# Patient Record
Sex: Female | Born: 1960
Health system: Southern US, Community
[De-identification: ages and names within clinical notes are randomized; demographics above are authoritative.]

## PROBLEM LIST (undated history)

## (undated) DIAGNOSIS — I10 Essential (primary) hypertension: Secondary | ICD-10-CM

## (undated) DIAGNOSIS — F410 Panic disorder [episodic paroxysmal anxiety] without agoraphobia: Secondary | ICD-10-CM

## (undated) DIAGNOSIS — K219 Gastro-esophageal reflux disease without esophagitis: Secondary | ICD-10-CM

---

## 1999-08-31 ENCOUNTER — Ambulatory Visit (HOSPITAL_COMMUNITY): Admission: RE | Admit: 1999-08-31 | Discharge: 1999-08-31 | Payer: Self-pay | Admitting: Obstetrics and Gynecology

## 1999-08-31 ENCOUNTER — Encounter (INDEPENDENT_AMBULATORY_CARE_PROVIDER_SITE_OTHER): Payer: Self-pay | Admitting: Specialist

## 1999-12-15 ENCOUNTER — Other Ambulatory Visit: Admission: RE | Admit: 1999-12-15 | Discharge: 1999-12-15 | Payer: Self-pay | Admitting: Obstetrics and Gynecology

## 1999-12-22 ENCOUNTER — Encounter: Payer: Self-pay | Admitting: Obstetrics and Gynecology

## 1999-12-22 ENCOUNTER — Encounter: Admission: RE | Admit: 1999-12-22 | Discharge: 1999-12-22 | Payer: Self-pay | Admitting: Obstetrics and Gynecology

## 2001-09-27 ENCOUNTER — Encounter: Payer: Self-pay | Admitting: Family Medicine

## 2001-09-27 ENCOUNTER — Encounter: Admission: RE | Admit: 2001-09-27 | Discharge: 2001-09-27 | Payer: Self-pay | Admitting: Family Medicine

## 2002-12-06 ENCOUNTER — Encounter (INDEPENDENT_AMBULATORY_CARE_PROVIDER_SITE_OTHER): Payer: Self-pay | Admitting: Specialist

## 2002-12-06 ENCOUNTER — Observation Stay (HOSPITAL_COMMUNITY): Admission: RE | Admit: 2002-12-06 | Discharge: 2002-12-07 | Payer: Self-pay | Admitting: Obstetrics and Gynecology

## 2003-01-14 ENCOUNTER — Ambulatory Visit (HOSPITAL_COMMUNITY): Admission: RE | Admit: 2003-01-14 | Discharge: 2003-01-14 | Payer: Self-pay | Admitting: Obstetrics and Gynecology

## 2003-01-14 ENCOUNTER — Encounter: Payer: Self-pay | Admitting: Obstetrics and Gynecology

## 2003-06-07 ENCOUNTER — Encounter: Admission: RE | Admit: 2003-06-07 | Discharge: 2003-06-07 | Payer: Self-pay | Admitting: Obstetrics and Gynecology

## 2005-01-30 ENCOUNTER — Ambulatory Visit (HOSPITAL_COMMUNITY): Admission: RE | Admit: 2005-01-30 | Discharge: 2005-01-30 | Payer: Self-pay | Admitting: Family Medicine

## 2005-09-20 ENCOUNTER — Encounter: Admission: RE | Admit: 2005-09-20 | Discharge: 2005-12-19 | Payer: Self-pay | Admitting: Family Medicine

## 2007-08-08 ENCOUNTER — Encounter: Admission: RE | Admit: 2007-08-08 | Discharge: 2007-08-08 | Payer: Self-pay | Admitting: Family Medicine

## 2008-10-22 ENCOUNTER — Encounter: Admission: RE | Admit: 2008-10-22 | Discharge: 2008-10-22 | Payer: Self-pay | Admitting: Family Medicine

## 2009-01-07 ENCOUNTER — Emergency Department (HOSPITAL_COMMUNITY): Admission: EM | Admit: 2009-01-07 | Discharge: 2009-01-07 | Payer: Self-pay | Admitting: Emergency Medicine

## 2009-02-05 ENCOUNTER — Emergency Department: Payer: Self-pay | Admitting: Emergency Medicine

## 2009-03-18 ENCOUNTER — Emergency Department: Payer: Self-pay | Admitting: Emergency Medicine

## 2009-03-21 ENCOUNTER — Observation Stay (HOSPITAL_COMMUNITY): Admission: EM | Admit: 2009-03-21 | Discharge: 2009-03-22 | Payer: Self-pay | Admitting: Emergency Medicine

## 2010-03-18 ENCOUNTER — Encounter: Admission: RE | Admit: 2010-03-18 | Discharge: 2010-03-18 | Payer: Self-pay | Admitting: Family Medicine

## 2010-11-07 LAB — DIFFERENTIAL
Basophils Absolute: 0 10*3/uL (ref 0.0–0.1)
Basophils Relative: 0 % (ref 0–1)
Lymphocytes Relative: 23 % (ref 12–46)
Monocytes Absolute: 0.4 10*3/uL (ref 0.1–1.0)
Neutro Abs: 4.4 10*3/uL (ref 1.7–7.7)
Neutrophils Relative %: 70 % (ref 43–77)

## 2010-11-07 LAB — POCT CARDIAC MARKERS
CKMB, poc: 1.2 ng/mL (ref 1.0–8.0)
Myoglobin, poc: 62.7 ng/mL (ref 12–200)
Myoglobin, poc: 71.7 ng/mL (ref 12–200)
Troponin i, poc: <0.05 ng/mL (ref 0.00–0.09)

## 2010-11-07 LAB — COMPREHENSIVE METABOLIC PANEL
ALT: 18 U/L (ref 0–35)
AST: 27 U/L (ref 0–37)
Albumin: 3.5 g/dL (ref 3.5–5.2)
CO2: 31 mEq/L (ref 19–32)
Chloride: 100 mEq/L (ref 96–112)
Creatinine, Ser: 1.02 mg/dL (ref 0.4–1.2)
GFR calc Af Amer: >60 mL/min (ref >60)
Potassium: 3.1 mEq/L — ABNORMAL LOW (ref 3.5–5.1)
Sodium: 138 mEq/L (ref 135–145)
Total Bilirubin: 1.1 mg/dL (ref 0.3–1.2)

## 2010-11-07 LAB — BASIC METABOLIC PANEL
CO2: 30 mEq/L (ref 19–32)
Calcium: 9.7 mg/dL (ref 8.4–10.5)
Creatinine, Ser: 0.79 mg/dL (ref 0.4–1.2)
GFR calc Af Amer: >60 mL/min (ref >60)
GFR calc non Af Amer: >60 mL/min (ref >60)
Glucose, Bld: 94 mg/dL (ref 70–99)
Sodium: 137 mEq/L (ref 135–145)

## 2010-11-07 LAB — CBC
HCT: 36 % (ref 36.0–46.0)
Hemoglobin: 12.1 g/dL (ref 12.0–15.0)
Hemoglobin: 12.9 g/dL (ref 12.0–15.0)
MCHC: 33.3 g/dL (ref 30.0–36.0)
MCHC: 33.7 g/dL (ref 30.0–36.0)
RBC: 4.27 MIL/uL (ref 3.87–5.11)
RDW: 13.1 % (ref 11.5–15.5)

## 2010-11-07 LAB — URINALYSIS, ROUTINE W REFLEX MICROSCOPIC
Bilirubin Urine: NEGATIVE
Hgb urine dipstick: NEGATIVE
Ketones, ur: NEGATIVE mg/dL
Nitrite: NEGATIVE
Protein, ur: NEGATIVE mg/dL
Urobilinogen, UA: 0.2 mg/dL (ref 0.0–1.0)

## 2010-11-07 LAB — CARDIAC PANEL(CRET KIN+CKTOT+MB+TROPI): CK, MB: 1 ng/mL (ref 0.3–4.0)

## 2010-11-07 LAB — POCT PREGNANCY, URINE: Preg Test, Ur: NEGATIVE

## 2010-12-15 NOTE — Consult Note (Signed)
NAMEJEANITA, Baker            ACCOUNT NO.:  [id number]   MEDICAL RECORD NO.:  [id number]          PATIENT TYPE:  INP   LOCATION:  3735                         FACILITY:  [facility]   PHYSICIAN:  Francisca December, M.D.  DATE OF BIRTH:  [birth year]   DATE OF CONSULTATION:  DATE OF DISCHARGE:  03/22/2009                                 CONSULTATION   REQUESTING PHYSICIAN:  [facility].   REASON FOR CONSULTATION:  Chest discomfort and palpitation.   HISTORY OF PRESENT ILLNESS:  Kristi Baker is a pleasant 49-year-  old African American woman who presented to [facility] yesterday with  symptoms of palpitation and chest discomfort.  The chest discomfort is  pressure like over the anterior substernal upper portion of the chest.  It does not radiate.  She has been having some episodes of chest  discomfort and lightheadedness for 3 months now, general feeling of  malaise associated with this.  On Tuesday, 4 days prior to admission,  she while sitting at her desk had the abrupt onset of a tachy  palpitation which was associated with lightheadedness and near syncope.  She had chest tightness associated that.  She had her blood pressure  blood pressure checked which was found to be elevated.  She cannot  recall what her pulse was at that time.  She went home and rested.  On  the day prior to admission, she had additional episode of palpitation  and chest discomfort, 2 of which occurred with minimal exertion.  They  have occurred in the past at rest.  Episodes last 3-5 minutes and then  slowly resolve on their own.  She cannot recall having chest discomfort  with rapid feeling of palpitation.  She has also had single palpitations  which are self limiting.   Her cardiac risk factors are hypertension only.  By her own account, her  lipids are well controlled.  She is not diabetic.  There is no family  history of early coronary artery disease.   PAST MEDICAL HISTORY:  1.  Hypertension.  2. GERD.  3. Status post hysterectomy and salpingo-oophorectomy.  She has had C-      sections in the past and breast lumpectomy surgery.   FAMILY HISTORY:  Significant for diabetes and hypertension in the father  and mother respectively.  She has 1 sister with breast and colon  carcinoma.   SOCIAL HISTORY:  She lives here in [place].  Her adult daughter has  recently moved in with her granddaughter.  She is caring for her elderly  mother who is ill and recently placed in a nursing home.  She feels  under an extraordinary amount of stress on an intermittent basis.  She  is not using alcohol or tobacco products.  No drug use.  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   CURRENT MEDICATIONS:  1. HCTZ 12.5 mg p.o. daily p.r.n. edema.  2. Pepcid AC 1 p.o. daily  3. Hydroxyzine 10 mg p.o. t.i.d. p.r.n. stress/anxiety.   ALLERGIES:  PENICILLIN and VALIUM caused paradoxical  reaction with  insomnia x2 days.  She has tolerated Xanax in the past.   REVIEW OF SYSTEMS:  Ten systems review is negative except as mentioned  above.   PHYSICAL EXAMINATION:  VITAL SIGNS:  Blood pressure is 126/54, pulse is  70 and regular, respiratory rate 14, temperature 99.1, and O2 saturation  100% on room air.  GENERAL:  This is a well-appearing, mildly obese, pleasant 50 year old  African American woman, in no distress.  HEENT:  Unremarkable.  Head is atraumatic and normocephalic.  Her pupils  are equal, round, and reactive to light.  Sclerae are anicteric.  Oral  mucosa is pink and moist.  Teeth and gums in good repair.  Tongue is not  coated.  NECK:  Supple without thyromegaly or masses.  The carotid upstrokes are  normal.  There is no bruit.  There is no JVD.  CHEST:  Clear with adequate excursion.  No wheezes, rales, or rhonchi.  HEART:  Regular rhythm.  Normal S1 and S2.  No S3 or S4 murmur, click,  or rub noted.  ABDOMEN:  Soft,  obese, and nontender.  No midline pulsatile mass.  Bowel  sounds present all quadrants.  EXTERNAL GENITALIA:  Without lesions.  RECTAL:  Not performed.  EXTREMITIES:  Full range of motion.  No edema.  Intact distal pulses.  NEUROLOGIC:  Cranial nerves II-XII intact.  Motor and sensory grossly  intact.  Gait is intact as well.  SKIN:  Without rashes or petechiae.  Otherwise, warm, dry, and clear.   ACCESSORY CLINICAL DATA:  Admission hemogram is normal.  D-dimer is  0.26.  Serum electrolytes normal with the exception of a potassium of  3.4, falling to 3.1 this morning.  Liver associated enzymes are normal.  Point-of-care cardiac enzymes negative.  Laboratory CK-MB and troponin  negative.  Urinalysis negative.   Echocardiogram, sinus rhythm and normal.   Admission chest x-ray, no active cardiopulmonary disease.   ASSESSMENT:  1. Atypical angina.  2. Tachy palpitation.  3. Presyncope.  4. Hypertension.  5. Gastroesophageal reflux disease.  6. Obesity.  7. Myocardial infarction has been ruled out.  8. Hypokalemia.   PLAN/RECOMMENDATIONS:  I agree with your management thus far which  includes aspirin, DVT prophylaxis Lovenox, and PPI.  We will plan for  exercise myocardial perfusion scintigraphy in the near future for  further risk assessment.  If negative, we will likely place on a 24-hour  ambulatory monitor as an outpatient. We will replete her hypokalemia  with oral potassium chloride today, likely coming from her p.r.n. use of  HCTZ.      Francisca December, M.D.    JHE/MEDQ  D:  03/22/2009  T:  03/22/2009  Job:  [id number]   cc:   C. Duane Lope, M.D.

## 2010-12-15 NOTE — H&P (Signed)
Kristi Baker, Kristi Baker            ACCOUNT NO.:  [id number]   MEDICAL RECORD NO.:  [id number]          PATIENT TYPE:  INP   LOCATION:  3735                         FACILITY:  [facility]   PHYSICIAN:  Donalynn Furlong, MD      DATE OF BIRTH:  06-09-1961   DATE OF ADMISSION:  03/21/2009  DATE OF DISCHARGE:                              HISTORY & PHYSICAL   PRIMARY CARE PHYSICIAN:  Dr. Laural Benes   CHIEF COMPLAINT:  Dizziness, near syncopal episode, palpitations and  chest pain.   HISTORY OF PRESENT ILLNESS:  Kristi Baker is a 50 year old African  American female who lives in [place] with her husband and family.  She presented to [facility] ED today.  She is started having feeling of  palpitation with chest pain on Tuesday as she was walking, she felt like  she was about to pass out but she did not pass out completely.  She was  feeling very dizzy on Tuesday after that she started having pressure-  like chest pain on the left side of chest associated with palpitations  which resolved on its own.  She had another episode on Tuesday with the  chest pain and palpitations.  She also felt worse on Friday with more  chest pain, palpitation.  Chest pain is pressure-like in nature over the  left side of chest without any radiation, no aggravating or relieving  factors.  Chest pain comes on on its own and then resolves on its own.  The patient denies any history of coronary artery disease or cardiac  workup in the past.  The patient has a family history of diabetes  mellitus in the father and hypertension in the mother.  The patient  denies any history of clots in the lung.  The patient denies any recent  immobilization.  The patient does have a history of gastroesophageal  reflux disease but her pain feels different than the gastroesophageal  reflux disease.  The patient denies any sick contacts or any pulmonary  infections in the past.   PAST MEDICAL HISTORY:  Hypertension, gastroesophageal reflux  disease,  hysterectomy, ovarian removal with a salpingo-oophorectomy, C-section in  the past, breast reaction surgery in the past.   FAMILY HISTORY:  Of cancer in the sister with breast cancer and colon  cancer.  Father with a diabetes mellitus.  Mother with hypertension.   SOCIAL HISTORY:  The patient denied history alcohol, drug or tobacco  use.  The patient lives with her husband and kids in [place].  She  works full-time.   REVIEW OF SYSTEMS:  Positive as per HPI, otherwise negative review  systems done for 14 systems.   ALLERGIES:  PENICILLIN.   HOME MEDICATION:  1. Hydroxyzine 10 mg p.o. t.i.d. p.r.n. for stress.  2. Hydrochlorothiazide 12.5 mg p.r.n. p.o. for high blood pressure.   PHYSICAL EXAM:  VITAL SIGNS:  Blood pressure 126/54, pulse 69,  respirations 13, temperature 99.1, oxygen saturation 100% on room air.  GENERAL:  Exam alert, oriented x3 laying in bed reading book and not in  acute distress.  CARDIOVASCULAR:  S1-S2 regular.  No murmur  or gallop.  LUNGS:  Clear to auscultation bilaterally.  No wheezing, rhonchi or  crackles.  ABDOMEN:  Nontender, nondistended.  Bowel sounds present.  EXTREMITIES:  No clubbing, cyanosis or edema.  Pulses palpable in all  extremities.  HEAD:  Normocephalic nontraumatic.  EYES:  Pupils react to light and accomodation.  She has reading glasses.  ORAL CAVITY:  Dry.  No thrush noted.  NECK:  No thyromegaly or JVD.  The patient is obese at neck.  SKIN:  No rash or bruise.  NEUROLOGY:  Shows intact cranial nerves, muscular strength, sensation  and reflexes.   LABORATORY DATA:  Shows EKG rate 65 per minute, normal sinus rhythm,  normal axis, normal intervals, normal ST-T waves, no prior EKG to check  for the comparison.  Basic metabolic panel unremarkable except potassium  3.4.  First set of cardiac enzymes negative.  CBC differential  unremarkable.  Chest x-ray unremarkable for any disease, D-dimer  pending.   ASSESSMENT AND  PLAN:  1. Near syncopal episode.  Dizziness and palpitation.  2. Pressure-like chest pain.  3. Hypokalemia.  4. History of hypertension.  5. History of gastroesophageal reflux disease.   PLAN:  Will admit the patient on telemetry bed under [facility] with  a diagnosis of chest pain, hypokalemia, dizziness, weakness.  The  patient will be on telemetry bed.  Will check CBC, CMP in the morning.  Will get couple more sets of cardiac enzymes with troponin tonight and  get EKG in the morning.  Will check urine pregnancy test, we will check  D-dimer now, check vital signs every 8 hours.  Will provide regular diet  with her normal saline 50 mL per hour for 1 liter.  We will start  Lovenox 40 mg subcu q. 24-hour for DVT prophylaxis.  Will start Nexium  for GI prophylaxis.  Will provide aspirin 325 mg q.24 h.  Will start  nitroglycerin p.r.n. for chest pain and provide potassium 40 mEq p.o.  q.12 h p.r.n. for 2 doses.  Will provide Tylenol, Phenergan and Ambien  p.r.n. for symptomatic control.  We will provide her with metoprolol,  lisinopril, simvastatin and Lovenox per MI rule out protocol.  Check  stress test on her tomorrow after we rule out MI.      Donalynn Furlong, MD  Electronically Signed     Donalynn Furlong, MD  Electronically Signed    TVP/MEDQ  D:  03/21/2009  T:  03/22/2009  Job:  [id number]   cc:   Dr. Laural Benes and Tenny Craw

## 2010-12-18 NOTE — Op Note (Signed)
[facility]  Patient:    Kristi Baker                    MRN: [id number] Proc. Date: 08/31/99 Adm. Date:  [id number] Attending:  Leonard Schwartz CC:         Kristi Baker, M.D.                           Operative Report  PREOPERATIVE DIAGNOSIS:       8 cm left dermoid cyst.  Fibroid uterus.  Obesity  (weight is 223 pounds).  POSTOPERATIVE DIAGNOSIS:      8 cm left dermoid cyst.  Fibroid uterus.  Obesity  (weight is 223 pounds).  OPERATION:                    Diagnostic laparoscopy with laparoscopic left salpingo-oophorectomy.  SURGEON:                      Janine Limbo, M.D.  ASSISTANT:  ANESTHESIA:                   General anesthesia.  ESTIMATED BLOOD LOSS:  INDICATIONS:                  Kristi Baker is a 50 year old female, para 2-0-0-2, who presents with a left dermoid cyst diagnosed by ultrasound.  The patient understands the indications for her surgical procedure and she accepts the risks of, but not limited to anesthetic complications, bleeding, infections, and possible damage to the surrounding organs.  FINDINGS:                     There was an 8 cm left dermoid cyst present.  The  right ovary appeared normal.  The fallopian tubes were normal except for defects from the patients prior tubal ligation.  The uterus had multiple fibroids the largest of which measuring approximately 3 x 5 cm.  It was located on the posterior surface toward the left.  The appendix, bowel, and liver appeared normal.  We did not spill the contents of the dermoid cyst in the abdominal cavity with removal.  DESCRIPTION OF PROCEDURE:     The patient was taken to the operating room where a general anesthesia was given.  The patients abdomen, perineum, and vagina were prepped with multiple layers of Betadine.  Examination under anesthesia was performed.  A Foley catheter was placed in the bladder.  A Hulka tenaculum was placed  inside the uterus.  The patient was sterilely draped.  The subumbilical rea was injected with 0.25% Marcaine.  An incision was made and the Veress needle was inserted into the abdominal cavity without difficulty.  Proper placement was confirmed using the saline drop test.  A pneumoperitoneum was obtained.  The laparoscopic trocar and then the laparoscope were substituted for the Veress needle. Care was taken not to damage the underlying structures.  The pelvic organs were visualized.  Two suprapubic incisions were made after injecting the skin with 0.25% Marcaine.  A 5 mm port was placed in the right lower quadrant and a 10/12 mm port was placed in the left lower quadrant.  Pictures were taken of the patients pelvic anatomy.  The left ureter was identified and followed throughout its course in the pelvis.  It was felt to be away from our areas of surgery.  An endoloop  as placed around the left infundibulopelvic ligament and tied securely.  0 Vicryl as the suture material used.  We were unable to place a second endoloop and therefore, a free tie was placed around the infundibulopelvic ligament using the laparoscopic instruments.  The ligament was then cut.  The endobasket was placed through the  10/12 mm port into the pelvic cavity.  The large dermoid cyst was placed in the  endobasket.  An incision was made on the capsule of the ovary and the contents f the dermoid cyst were removed using a Nezhat aspirator.  We then tied the bag securely and the instrument was removed from the abdominal cavity.  The incision in the right lower quadrant was then enlarged and the specimen was removed.  The fascia was closed using a running suture of 0 Vicryl.  The subcutaneous layer was closed using 2-0 Vicryl.  The skin was reapproximated using 4-0 Vicryl.  The pelvis was reinflated.  The pelvic cavity was irrigated thoroughly.  There was no signs of bleeding and no signs of contents in the  abdominal cavity.  Contents of the dermoid cyst inside the abdominal cavity.  The pneumoperitoneum was allowed to escape.  Hemostasis was again confirmed and all instruments were removed.  The incisions  were closed using deep and superficial sutures of 4-0 Vicryl.  Sponge, needle, nd instrument counts were correct x 2 occasions.  The estimated blood loss for the  procedure was 10 cc.  The patient tolerated her procedure well.  She was noted o drain clear yellow urine.  We did inspect the bowel carefully prior to removing all instruments and there was no evidence of harm.  FOLLOW-UP:                    The patient was given Tylenol No.3 one to two p.o. q.4h. p.r.n. pain.  She was told to take Advil 600 mg q.6h. for moderate to mild pain.  She was given a copy of the postoperative instruction sheet as prepared y [facility] for patients who have undergone laparoscopy.  She  will call for questions or concerns. DD:  08/31/99 TD:  08/31/99 Job: [id number]

## 2010-12-18 NOTE — Discharge Summary (Signed)
NAME:  Kristi Baker, Kristi Baker                      ACCOUNT NO.:  [id number]   MEDICAL RECORD NO.:  [id number]                   PATIENT TYPE:  OBV   LOCATION:  9322                                 [facility]  [facility]   PHYSICIAN:  Janine Limbo, M.D.            DATE OF BIRTH:  1961/04/25   DATE OF ADMISSION:  12/06/2002  DATE OF DISCHARGE:  12/07/2002                                 DISCHARGE SUMMARY   DISCHARGE DIAGNOSES:  1. Fibroid uterus, increasing.  2. Dysmenorrhea.  3. Pelvic adhesions.  4. Status post left salpingo-oophorectomy.   OPERATION AND FINDINGS:  On the date of admission, patient underwent a total  vaginal hysterectomy with lysis of adhesions, tolerating procedure well.  Patient was found to have a fibroid uterus (174 gm) with a normal-appearing  right tube and ovary along with pelvic adhesions.  Patient's left tube and  ovary were surgically absent.   HISTORY OF PRESENT ILLNESS:  Kristi Baker is a 50 year old female, para 2,  0-0-2, who presents for vaginal hysterectomy because of large fibroids and  increasing dysmenorrhea.  Please see patient's dictated history and physical  examination for details.   PHYSICAL EXAMINATION:  VITAL SIGNS:  Weight 223 pounds.  GENERAL:  Within normal limits.  PELVIC:  External genitalia is normal.  Vagina is normal.  Cervix is  nontender.  Uterus is 10-week size, irregular and firm.  Adnexa:  No masses.  RECTOVAGINAL:  Exam deferred.   HOSPITAL COURSE:  On the date of admission, patient underwent aforementioned  procedures, tolerating them well.  By post-op day #1 the patient had resumed  bowel and bladder function and was therefore deemed ready for discharge  home.  Post-op hemoglobin was 11.2 (pre-op hemoglobin 11.9)   DISCHARGE MEDICATIONS:  1. Vicodin 1-2 tablets every 4-6 hours as needed for pain.  2. Ibuprofen 600 mg 1 tablet with food every 6 hours for 5 days, then as     needed for pain.  3. Phenergan 25 mg 1 tablet  every 6 hours as needed for nausea.  4. Colace 100 mg 1 tablet twice daily until bowel movements are regular.   FOLLOW UP:  Patient is to follow with [facility] for a 6-week  postoperative visit with Dr. Leonard Schwartz.   DISCHARGE INSTRUCTIONS:  Patient was given a copy of [facility]  postoperative instruction sheet.  She was further advised to avoid driving  for 2 weeks, heavy lifting for 4 weeks, and intercourse for 6 weeks.   DIET:  Patient's diet was without restriction.    FINAL PATHOLOGY:  Uterus and Cervix:  Cervix - slight cervicitis and  squamous metaplasia.  No dysplasia identified.  Remote C-section scar was  disordered proliferative endometrium.  No hyperplasia or malignancy  identified.  Leiomyomata, intramural and subserosal:  Uterine serosal  fibrous adhesions.  No endometriosis or malignancy identified.     Elmira J. Powell, P.A.  Janine Limbo, M.D.    EJP/MEDQ  D:  01/08/2003  T:  01/08/2003  Job:  [id number]

## 2010-12-18 NOTE — H&P (Signed)
NAME:  Kristi Baker, Kristi Baker                      ACCOUNT NO.:  [id number]   MEDICAL RECORD NO.:  [id number]                   PATIENT TYPE:  AMB   LOCATION:  SDC                                  FACILITY:  [facility]   PHYSICIAN:  Janine Limbo, M.D.            DATE OF BIRTH:  [birth year]   DATE OF ADMISSION:  12/06/2002  DATE OF DISCHARGE:                                HISTORY & PHYSICAL   HISTORY OF PRESENT ILLNESS:  The patient is a 50 year old female, para 2-0-0-  2, who presents for a vaginal hysterectomy because of large fibroids. An  endometrial biopsy  has been obtained, and endometrium was noted to be  benign.  Non-steroidal anti-inflammatory agents have not relieved her  discomfort.  Her most recent Pap smear was within normal limits.  The  patient complains of increasing dysmenorrhea.   Her prior GYN surgeries include a tubal ligation as well as a low transverse  cesarean section.  In 2001, the patient had a laparoscopic left salpingo-  oophorectomy because of an 8 cm left dermoid cyst.   PAST MEDICAL HISTORY:  The patient had a breast reduction in 1994.  She had  a cyst removed from her right foot.   DRUG ALLERGIES:  PENICILLIN causes a rash   SOCIAL HISTORY:  The patient denies cigarette use, alcohol use, and  recreational drug use.   REVIEW OF SYSTEMS:  Noncontributory.   FAMILY HISTORY:  The patient's sister has breast cancer.  Her father has  diabetes mellitus.  The patient's mother has hypertension.   PHYSICAL EXAMINATION:  VITAL SIGNS:  Weight 223 pounds.  HEENT:  Within normal limits.  CHEST:  Clear.  HEART:  Regular rate and rhythm.  BREASTS:  Without masses.  ABDOMEN:  Nontender.  EXTREMITIES:  Within normal limits.  NEUROLOGIC:  Grossly normal.  PELVIC:  External genitalia is normal. The vagina is normal.  Cervix is  nontender.  Uterus is 10-week size, irregular, and firm.  Adnexa no masses.  Rectovaginal exam confirms.   ASSESSMENT:  1. Fibroid  uterus.  2. Increasing dysmenorrhea.   PLAN:  We have discussed the alternatives for therapy which include pain  medications, uterine artery embolization, IUD use, cryoablation,  laparoscopic surgery, and hormonal therapy.  The patient has carefully  considered the risks  and benefits of all of the above and has decided to proceed with definitive  therapy by way of vaginal hysterectomy.  She understands the indications for  her procedure, and she accepts the risks of, but not limited to, anesthetic  complications, bleeding, infection, and possible damage to the surrounding  organs.                                                Janine Limbo, M.D.    AVS/MEDQ  D:  12/02/2002  T:  12/02/2002  Job:  [id number]

## 2010-12-18 NOTE — Op Note (Signed)
NAME:  Kristi Baker, Kristi Baker                      ACCOUNT NO.:  [id number]   MEDICAL RECORD NO.:  [id number]                   PATIENT TYPE:  OBV   LOCATION:  9322                                 FACILITY:  [facility]   PHYSICIAN:  Janine Limbo, M.D.            DATE OF BIRTH:  May 08, 1961   DATE OF PROCEDURE:  12/06/2002  DATE OF DISCHARGE:                                 OPERATIVE REPORT   PREOPERATIVE DIAGNOSES:  1. Ten week-size fibroid uterus.  2. Increasing dysmenorrhea.  3. Anemia (hemoglobin 11.9).   POSTOPERATIVE DIAGNOSES:  1. Ten week-size fibroid uterus.  2. Increasing dysmenorrhea.  3. Anemia (hemoglobin 11.9).  4. Pelvic adhesions.   PROCEDURE:  Vaginal hysterectomy with lysis of adhesions.   SURGEON:  Janine Limbo, M.D.   ASSISTANT:  Marquis Lunch. Adline Peals.   ANESTHESIA:  General.   DISPOSITION:  The patient is a 50 year old female, para 2-0-0-2, who  presents with the above-mentioned diagnoses.  The patient has had a prior  cesarean section.  She has also had a prior left salpingo-oophorectomy  because of a dermoid cyst.  She understands the indications for her  procedure and she accepts the risks of, but not limited to, anesthetic  complications, bleeding, infection, and possible damage to the surrounding  organs.   FINDINGS:  The uterus was 10 weeks' size and it contained multiple fibroids.  The estimated uterine weight was 190 g.  The left fallopian tube and ovary  were surgically absent.  The right fallopian tube and ovary appeared normal.  There were adhesions between the left pelvic sidewall and the left posterior  uterus.   DESCRIPTION OF PROCEDURE:  The patient was taken to the operating room,  where a general anesthetic was given.  The patient's abdomen, perineum, and  vagina were prepped with multiple layers of Betadine.  A Foley catheter was  placed in the bladder.  Examination under anesthesia was performed.  The  patient was sterilely  draped.  The cervix was injected with a diluted  solution of Pitressin and saline.  A circumferential incision was made  around the cervix and the posterior cul-de-sac was sharply entered.  Alternating from right to left, the uterosacral ligaments and the  paracervical tissues were clamped, cut, sutured, and tied securely.  The  posterior cuff of the vagina was then sutured using a running locking suture  of 0 Vicryl.  We then carefully entered the anterior cul-de-sac.  Care was  taken not to damage the bladder.  Again alternating from right to left, the  parametrial tissue, the uterine arteries, and the upper pedicles were  clamped, cut, sutured, and tied securely.  The uterus was inverted through  the posterior colpotomy.  The remainder of the upper pedicles were clamped  and cut and the uterus was removed from the operative field.  The upper  pedicles were secured using free ties and then suture ligatures.  Hemostasis  was noted to be adequate.  The sutures attached to the uterosacral ligaments  were brought out through the vaginal angles and tied securely.  A McCall  culdoplasty suture was placed in the posterior cul-de-sac, incorporating the  uterosacral ligaments bilaterally and the posterior peritoneum.  A final  check was made for hemostasis and again hemostasis was adequate.  The  bladder was examined, and there was no evidence of damage.  The patient was  noted to drain clear yellow urine.  The vaginal cuff was then closed using  figure-of-eight sutures incorporating the anterior vaginal mucosa, the  anterior peritoneum, the posterior peritoneum, and the posterior vaginal  mucosa.  The McCall culdoplasty suture was tied securely and the apex of the  vagina was noted to elevate into the midpelvis.  Sponge, needle, and  instrument counts were correct on two occasions.  The estimated blood loss  was 75 mL.  The patient tolerated her procedure well.  Vicryl 0 was the  suture material  used throughout.  The patient was awakened from her  anesthetic and then taken to the recovery room in stable condition.                                               Janine Limbo, M.D.    AVS/MEDQ  D:  12/06/2002  T:  12/06/2002  Job:  [id number]

## 2011-01-03 ENCOUNTER — Emergency Department (HOSPITAL_COMMUNITY)
Admission: EM | Admit: 2011-01-03 | Discharge: 2011-01-03 | Disposition: A | Discharge status: Home / Self Care | Payer: 59 | Attending: Emergency Medicine | Admitting: Emergency Medicine

## 2011-01-03 DIAGNOSIS — R300 Dysuria: Secondary | ICD-10-CM | POA: Insufficient documentation

## 2011-01-03 DIAGNOSIS — I10 Essential (primary) hypertension: Secondary | ICD-10-CM | POA: Insufficient documentation

## 2011-01-03 DIAGNOSIS — N3090 Cystitis, unspecified without hematuria: Secondary | ICD-10-CM | POA: Insufficient documentation

## 2011-01-03 LAB — URINE MICROSCOPIC-ADD ON

## 2011-01-03 LAB — URINALYSIS, ROUTINE W REFLEX MICROSCOPIC
Glucose, UA: NEGATIVE mg/dL
pH: 5.5 (ref 5.0–8.0)

## 2011-01-05 LAB — URINE CULTURE

## 2011-04-30 ENCOUNTER — Emergency Department (HOSPITAL_COMMUNITY): Payer: 59

## 2011-04-30 ENCOUNTER — Emergency Department (HOSPITAL_COMMUNITY)
Admission: EM | Admit: 2011-04-30 | Discharge: 2011-04-30 | Disposition: A | Discharge status: Home / Self Care | Payer: 59 | Attending: Emergency Medicine | Admitting: Emergency Medicine

## 2011-04-30 DIAGNOSIS — I10 Essential (primary) hypertension: Secondary | ICD-10-CM | POA: Insufficient documentation

## 2011-04-30 DIAGNOSIS — R0789 Other chest pain: Secondary | ICD-10-CM | POA: Insufficient documentation

## 2011-04-30 DIAGNOSIS — S20219A Contusion of unspecified front wall of thorax, initial encounter: Secondary | ICD-10-CM | POA: Insufficient documentation

## 2011-04-30 DIAGNOSIS — I059 Rheumatic mitral valve disease, unspecified: Secondary | ICD-10-CM | POA: Insufficient documentation

## 2013-02-22 ENCOUNTER — Emergency Department: Payer: Self-pay | Admitting: Emergency Medicine

## 2013-02-22 LAB — COMPREHENSIVE METABOLIC PANEL
Albumin: 3.5 g/dL (ref 3.4–5.0)
Alkaline Phosphatase: 75 U/L (ref 50–136)
Anion Gap: 5 — ABNORMAL LOW (ref 7–16)
BUN: 16 mg/dL (ref 7–18)
Calcium, Total: 8.5 mg/dL (ref 8.5–10.1)
Chloride: 105 mmol/L (ref 98–107)
Co2: 29 mmol/L (ref 21–32)
EGFR (African American): >60
EGFR (Non-African Amer.): >60
Glucose: 98 mg/dL (ref 65–99)
Potassium: 3.4 mmol/L — ABNORMAL LOW (ref 3.5–5.1)

## 2013-02-22 LAB — URINALYSIS, COMPLETE
Bilirubin,UR: NEGATIVE
Blood: NEGATIVE
Ketone: NEGATIVE
Protein: NEGATIVE
Squamous Epithelial: 4
WBC UR: 1 /HPF (ref 0–5)

## 2013-02-22 LAB — CBC
MCH: 26.6 pg (ref 26.0–34.0)
RBC: 4.31 10*6/uL (ref 3.80–5.20)
RDW: 13.5 % (ref 11.5–14.5)

## 2014-05-13 ENCOUNTER — Other Ambulatory Visit: Payer: Self-pay

## 2014-05-13 DIAGNOSIS — Z1231 Encounter for screening mammogram for malignant neoplasm of breast: Secondary | ICD-10-CM

## 2014-05-28 ENCOUNTER — Encounter (INDEPENDENT_AMBULATORY_CARE_PROVIDER_SITE_OTHER): Payer: Self-pay

## 2014-05-28 ENCOUNTER — Ambulatory Visit
Admission: RE | Admit: 2014-05-28 | Discharge: 2014-05-28 | Disposition: A | Discharge status: Home / Self Care | Payer: 59 | Source: Ambulatory Visit

## 2014-05-28 DIAGNOSIS — Z1231 Encounter for screening mammogram for malignant neoplasm of breast: Secondary | ICD-10-CM

## 2015-05-13 ENCOUNTER — Other Ambulatory Visit: Payer: Self-pay

## 2015-05-13 DIAGNOSIS — Z1231 Encounter for screening mammogram for malignant neoplasm of breast: Secondary | ICD-10-CM

## 2015-06-06 ENCOUNTER — Ambulatory Visit: Payer: Self-pay

## 2015-07-09 ENCOUNTER — Ambulatory Visit: Payer: Self-pay

## 2015-08-11 ENCOUNTER — Ambulatory Visit: Payer: Self-pay

## 2015-08-25 ENCOUNTER — Ambulatory Visit: Payer: Self-pay

## 2015-09-09 ENCOUNTER — Ambulatory Visit
Admission: RE | Admit: 2015-09-09 | Discharge: 2015-09-09 | Disposition: A | Discharge status: Home / Self Care | Payer: 59 | Source: Ambulatory Visit

## 2015-09-09 DIAGNOSIS — Z1231 Encounter for screening mammogram for malignant neoplasm of breast: Secondary | ICD-10-CM

## 2016-12-17 ENCOUNTER — Other Ambulatory Visit: Payer: Self-pay | Admitting: Family Medicine

## 2016-12-17 DIAGNOSIS — Z1231 Encounter for screening mammogram for malignant neoplasm of breast: Secondary | ICD-10-CM

## 2017-08-31 ENCOUNTER — Encounter: Payer: Self-pay | Admitting: Podiatry

## 2017-08-31 ENCOUNTER — Ambulatory Visit (INDEPENDENT_AMBULATORY_CARE_PROVIDER_SITE_OTHER): Payer: 59

## 2017-08-31 ENCOUNTER — Ambulatory Visit: Payer: 59 | Admitting: Podiatry

## 2017-08-31 DIAGNOSIS — M722 Plantar fascial fibromatosis: Secondary | ICD-10-CM | POA: Diagnosis not present

## 2017-08-31 DIAGNOSIS — R52 Pain, unspecified: Secondary | ICD-10-CM

## 2017-08-31 MED ORDER — MELOXICAM 15 MG PO TABS: 15.0000 mg | ORAL_TABLET | Freq: Every day | ORAL | 0 refills | Status: DC

## 2017-08-31 NOTE — Progress Notes (Signed)
   Subjective:    Patient ID: Kristi Baker, female    DOB: 01-05-1961, 57 y.o.   MRN: [id number]  HPI This patient presents the office with chief complaint of a painful left heel.  Patient states that the heel has been painful on and off for the last month.  She admits to exercising by walking over the last few weeks.  She says she experiences pain upon rising in the morning and standing from a sitting position.  She says she has occasionally taken ibuprofen for the pain, but the pain continues to worsen.  . She says that her pain level is approximately 5-6 out of 10.  She presents the office today for an evaluation and treatment of her painful left foot    Review of Systems  All other systems reviewed and are negative.      Objective:   Physical Exam General Appearance  Alert, conversant and in no acute stress.  Vascular  Dorsalis pedis and posterior pulses are palpable  bilaterally.  Capillary return is within normal limits  bilaterally. Temperature is within normal limits  Bilaterally.  Neurologic  Senn-Weinstein monofilament wire test within normal limits  bilaterally. Muscle power within normal limits bilaterally.  Nails , normal nails noted with no evidence of fungal or bacterial infection.  Orthopedic  No limitations of motion of motion feet bilaterally.  No crepitus or effusions noted.  . Patient has a mild HAV deformity first MPJ bilaterally.  Patient has limited dorsiflexion noted in the first MPJ of the left foot.  She gets approximately 30 dorsiflexion.  . She also has a prominent dorsomedial exostosis first metatarsal, left foot.  Palpable pain noted at the insertion of the plantar fascia of the left foot.  Skin  normotropic skin with no porokeratosis noted bilaterally.  No signs of infections or ulcers noted.          Assessment & Plan:  Plantar fascitis left heel with FHL  IE  . X-rays reveal calcification at the insertion of the plantar fascia left heel. There  is a narrowing of the first MPJ left foot.  Mild dorsomedial exostosis first MPJ left foot. . Discussed this condition with this patient.  Explain that she has plantar fasciitis secondary to a hallux limitus first MPJ left foot.  I recommended stretching, icing and rest to her left heel.  Also told her she needs better footwear when she exercises.  Prescribed a power step insoles to be worn in her shoes.  Prescribed Mobic 15 mg 1 every day as needed for pain.  Patient is to return to the office in 3 weeks and if pain persists we will consider injection therapy.  This patient also is a candidate for kinetic wedge orthotics.    Helane GuntherGregory Dequavion Follette DPM

## 2017-09-07 DIAGNOSIS — E039 Hypothyroidism, unspecified: Secondary | ICD-10-CM | POA: Diagnosis not present

## 2017-09-08 DIAGNOSIS — J310 Chronic rhinitis: Secondary | ICD-10-CM | POA: Diagnosis not present

## 2017-09-15 ENCOUNTER — Other Ambulatory Visit: Payer: Self-pay | Admitting: Podiatry

## 2017-09-15 DIAGNOSIS — M722 Plantar fascial fibromatosis: Secondary | ICD-10-CM

## 2017-09-15 DIAGNOSIS — R7303 Prediabetes: Secondary | ICD-10-CM | POA: Diagnosis not present

## 2017-09-15 DIAGNOSIS — E039 Hypothyroidism, unspecified: Secondary | ICD-10-CM | POA: Diagnosis not present

## 2017-09-15 DIAGNOSIS — I10 Essential (primary) hypertension: Secondary | ICD-10-CM | POA: Diagnosis not present

## 2017-09-15 DIAGNOSIS — E559 Vitamin D deficiency, unspecified: Secondary | ICD-10-CM | POA: Diagnosis not present

## 2017-09-19 ENCOUNTER — Ambulatory Visit: Payer: Self-pay | Admitting: Podiatry

## 2017-09-21 ENCOUNTER — Encounter: Payer: Self-pay | Admitting: Podiatry

## 2017-09-21 ENCOUNTER — Ambulatory Visit: Payer: 59 | Admitting: Podiatry

## 2017-09-21 DIAGNOSIS — M722 Plantar fascial fibromatosis: Secondary | ICD-10-CM

## 2017-09-21 NOTE — Progress Notes (Signed)
This patient presents the office for continued evaluation and treatment of her painful left heel.  She was diagnosed with plantar fasciitis as well as a functional hallux limitus.  She says that her pain was 5 out of 6 last visit and is presently 3 out of 4.  She says she has taken the Mobic and worn her insoles.  She still says that there is pain present, but it has improved in the last 2 weeks.  She presents the office today for continued evaluation and treatment of her painful left heel   General Appearance  Alert, conversant and in no acute stress.  Vascular  Dorsalis pedis and posterior pulses are palpable  bilaterally.  Capillary return is within normal limits  bilaterally. Temperature is within normal limits  Bilaterally.  Neurologic  Senn-Weinstein monofilament wire test within normal limits  bilaterally. Muscle power within normal limits bilaterally.  Nails Normal nails noted with no evidence of bacterial or fungal infection.  Orthopedic  No limitations of motion of motion feet bilaterally.  No crepitus or effusions noted.  Functional hallux limitus noted left foot.  Palpable pain persists at the insertion of the plantar fascia left foot.  Skin  normotropic skin with no porokeratosis noted bilaterally.  No signs of infections or ulcers noted.    Plantar fascitis left foot.  ROV  patient states that she has been taking her medication and wearing her power step insoles but pain persists.  . We decided to treat her with injection therapy in her left heel.  Injection therapy using 1.0 cc. Of 2% xylocaine( 20 mg.) plus 1 cc. of kenalog-la ( 10 mg) plus 1/2 cc. of dexamethazone phosphate ( 2 mg).  Patient was told to return to the office in 3 weeks for further evaluation and treatment.  Patient is not interested in orthotic care at this time.   Helane GuntherGregory Hero Kulish DPM

## 2017-09-26 DIAGNOSIS — I10 Essential (primary) hypertension: Secondary | ICD-10-CM | POA: Diagnosis not present

## 2017-09-26 DIAGNOSIS — E7800 Pure hypercholesterolemia, unspecified: Secondary | ICD-10-CM | POA: Diagnosis not present

## 2017-09-26 DIAGNOSIS — E039 Hypothyroidism, unspecified: Secondary | ICD-10-CM | POA: Diagnosis not present

## 2017-09-27 ENCOUNTER — Other Ambulatory Visit: Payer: Self-pay | Admitting: Podiatry

## 2017-10-06 DIAGNOSIS — I10 Essential (primary) hypertension: Secondary | ICD-10-CM | POA: Diagnosis not present

## 2017-10-12 ENCOUNTER — Ambulatory Visit: Payer: 59 | Admitting: Podiatry

## 2017-10-12 ENCOUNTER — Encounter: Payer: Self-pay | Admitting: Podiatry

## 2017-10-12 DIAGNOSIS — M722 Plantar fascial fibromatosis: Secondary | ICD-10-CM

## 2017-10-12 NOTE — Progress Notes (Signed)
This patient presents the office for continued evaluation and treatment of her painful left heel.  She was diagnosed with plantar fasciitis as well as a functional hallux limitus.  She says that her pain is 1 or 2 today but she [facility]  She said her pain caused her to leave the park early.  She says the insoles and Mobic are minimal helpful.  She requests an additional injection.     General Appearance  Alert, conversant and in no acute stress.  Vascular  Dorsalis pedis and posterior pulses are palpable  bilaterally.  Capillary return is within normal limits  bilaterally. Temperature is within normal limits  Bilaterally.  Neurologic  Senn-Weinstein monofilament wire test within normal limits  bilaterally. Muscle power within normal limits bilaterally.  Nails Normal nails noted with no evidence of bacterial or fungal infection.  Orthopedic  No limitations of motion of motion feet bilaterally.  No crepitus or effusions noted.  Functional hallux limitus noted left foot.  Palpable pain persists at the insertion of the plantar fascia left foot.  Skin  normotropic skin with no porokeratosis noted bilaterally.  No signs of infections or ulcers noted.    Plantar fascitis left foot.  ROV  patient states that she has been taking her medication and wearing her power step insoles but pain persists.  . We decided to treat her with injection therapy in her left heel.  Injection therapy using 1.0 cc. Of 2% xylocaine( 20 mg.) plus 1 cc. of kenalog-la ( 10 mg) plus 1/2 cc. of dexamethazone phosphate ( 2 mg).  Patient was told to return to the office in 4 weeks for further evaluation and treatment. Consider orthoses in future.     Helane GuntherGregory Marks Scalera DPM

## 2017-10-14 DIAGNOSIS — I10 Essential (primary) hypertension: Secondary | ICD-10-CM | POA: Diagnosis not present

## 2017-10-21 DIAGNOSIS — I10 Essential (primary) hypertension: Secondary | ICD-10-CM | POA: Diagnosis not present

## 2017-11-02 DIAGNOSIS — I10 Essential (primary) hypertension: Secondary | ICD-10-CM | POA: Diagnosis not present

## 2017-11-07 DIAGNOSIS — Z630 Problems in relationship with spouse or partner: Secondary | ICD-10-CM | POA: Diagnosis not present

## 2017-11-08 DIAGNOSIS — Z630 Problems in relationship with spouse or partner: Secondary | ICD-10-CM | POA: Diagnosis not present

## 2017-11-09 ENCOUNTER — Ambulatory Visit: Payer: 59 | Admitting: Podiatry

## 2017-11-11 DIAGNOSIS — I10 Essential (primary) hypertension: Secondary | ICD-10-CM | POA: Diagnosis not present

## 2017-11-21 DIAGNOSIS — Z630 Problems in relationship with spouse or partner: Secondary | ICD-10-CM | POA: Diagnosis not present

## 2017-12-02 DIAGNOSIS — E7800 Pure hypercholesterolemia, unspecified: Secondary | ICD-10-CM | POA: Diagnosis not present

## 2017-12-02 DIAGNOSIS — I10 Essential (primary) hypertension: Secondary | ICD-10-CM | POA: Diagnosis not present

## 2017-12-06 ENCOUNTER — Ambulatory Visit: Payer: 59

## 2017-12-06 DIAGNOSIS — Z630 Problems in relationship with spouse or partner: Secondary | ICD-10-CM | POA: Diagnosis not present

## 2017-12-23 ENCOUNTER — Ambulatory Visit: Payer: 59

## 2017-12-23 DIAGNOSIS — Z630 Problems in relationship with spouse or partner: Secondary | ICD-10-CM | POA: Diagnosis not present

## 2018-01-09 DIAGNOSIS — Z630 Problems in relationship with spouse or partner: Secondary | ICD-10-CM | POA: Diagnosis not present

## 2018-02-14 DIAGNOSIS — I10 Essential (primary) hypertension: Secondary | ICD-10-CM | POA: Diagnosis not present

## 2018-02-14 DIAGNOSIS — E559 Vitamin D deficiency, unspecified: Secondary | ICD-10-CM | POA: Diagnosis not present

## 2018-03-06 DIAGNOSIS — Z630 Problems in relationship with spouse or partner: Secondary | ICD-10-CM | POA: Diagnosis not present

## 2018-04-25 ENCOUNTER — Other Ambulatory Visit: Payer: Self-pay | Admitting: Family Medicine

## 2018-04-25 DIAGNOSIS — Z1231 Encounter for screening mammogram for malignant neoplasm of breast: Secondary | ICD-10-CM

## 2018-05-10 DIAGNOSIS — I10 Essential (primary) hypertension: Secondary | ICD-10-CM | POA: Diagnosis not present

## 2018-05-10 DIAGNOSIS — K219 Gastro-esophageal reflux disease without esophagitis: Secondary | ICD-10-CM | POA: Diagnosis not present

## 2018-05-16 DIAGNOSIS — R238 Other skin changes: Secondary | ICD-10-CM | POA: Diagnosis not present

## 2018-05-16 DIAGNOSIS — R21 Rash and other nonspecific skin eruption: Secondary | ICD-10-CM | POA: Diagnosis not present

## 2018-05-23 ENCOUNTER — Ambulatory Visit: Payer: 59

## 2018-05-30 ENCOUNTER — Encounter: Payer: Self-pay | Admitting: Radiology

## 2018-05-30 ENCOUNTER — Ambulatory Visit
Admission: RE | Admit: 2018-05-30 | Discharge: 2018-05-30 | Disposition: A | Discharge status: Home / Self Care | Payer: 59 | Source: Ambulatory Visit | Attending: Family Medicine | Admitting: Family Medicine

## 2018-05-30 DIAGNOSIS — Z1231 Encounter for screening mammogram for malignant neoplasm of breast: Secondary | ICD-10-CM | POA: Diagnosis not present

## 2018-08-15 ENCOUNTER — Encounter: Payer: Self-pay | Admitting: Emergency Medicine

## 2018-08-15 ENCOUNTER — Other Ambulatory Visit: Payer: Self-pay

## 2018-08-15 ENCOUNTER — Emergency Department
Admission: EM | Admit: 2018-08-15 | Discharge: 2018-08-15 | Disposition: A | Discharge status: Home / Self Care | Payer: Managed Care, Other (non HMO) | Attending: Emergency Medicine | Admitting: Emergency Medicine

## 2018-08-15 DIAGNOSIS — Z79899 Other long term (current) drug therapy: Secondary | ICD-10-CM | POA: Insufficient documentation

## 2018-08-15 DIAGNOSIS — I10 Essential (primary) hypertension: Secondary | ICD-10-CM

## 2018-08-15 LAB — CBC
HEMATOCRIT: 35.3 % — AB (ref 36.0–46.0)
Hemoglobin: 11.3 g/dL — ABNORMAL LOW (ref 12.0–15.0)
MCH: 25.8 pg — AB (ref 26.0–34.0)
MCHC: 32 g/dL (ref 30.0–36.0)
MCV: 80.6 fL (ref 80.0–100.0)
Platelets: 250 10*3/uL (ref 150–400)
RBC: 4.38 MIL/uL (ref 3.87–5.11)
RDW: 13.7 % (ref 11.5–15.5)
WBC: 4.4 10*3/uL (ref 4.0–10.5)
nRBC: 0 % (ref 0.0–0.2)

## 2018-08-15 LAB — COMPREHENSIVE METABOLIC PANEL
ALK PHOS: 78 U/L (ref 38–126)
ALT: 16 U/L (ref 0–44)
ANION GAP: 7 (ref 5–15)
AST: 18 U/L (ref 15–41)
Albumin: 3.7 g/dL (ref 3.5–5.0)
BUN: 16 mg/dL (ref 6–20)
CALCIUM: 8.6 mg/dL — AB (ref 8.9–10.3)
CO2: 27 mmol/L (ref 22–32)
Chloride: 104 mmol/L (ref 98–111)
Creatinine, Ser: 0.89 mg/dL (ref 0.44–1.00)
Glucose, Bld: 111 mg/dL — ABNORMAL HIGH (ref 70–99)
POTASSIUM: 3.7 mmol/L (ref 3.5–5.1)
Sodium: 138 mmol/L (ref 135–145)
TOTAL PROTEIN: 6.8 g/dL (ref 6.5–8.1)
Total Bilirubin: 0.6 mg/dL (ref 0.3–1.2)

## 2018-08-15 LAB — TROPONIN I: Troponin I: <0.03 ng/mL (ref <0.03)

## 2018-08-15 NOTE — ED Triage Notes (Signed)
Pt here with c/o driving on the way to work this am and felt "flushed," followed by her whole body feeling weak. Walked to triage with no issues, ate breakfast and took her bp med before leaving house. States the flushed feeling "was a scary feeling, but I wasn't scared." Appears in no distress, no numbness or tingling, speech clear, face symmetrical. NAD.

## 2018-08-15 NOTE — ED Provider Notes (Signed)
[facility] Emergency Department Provider Note  Time seen: 10:55 AM  I have reviewed the triage vital signs and the nursing notes.   HISTORY  Chief Complaint No chief complaint on file.    HPI Kristi Baker is a 58 y.o. female with a past medical history of hypertension presents to the emergency department for evaluation.  According to the patient around 830 this morning she was driving to work when she suddenly felt a flushed feeling over her body.  States she was concerned so she went home, took her blood pressure medications and ultimately came to the emergency department to make sure she was not having a heart attack per patient.  Patient denies any chest pain or trouble breathing at any point.  Patient states her blood pressure was elevated 142/67 upon arrival.  Patient states she has felt a similar sensation in the past with high blood pressure.  Currently denies any symptoms whatsoever.  Denies any chest pain, trouble breathing.  Denies any flushing dizziness or lightheadedness.   History reviewed. No pertinent past medical history.  There are no active problems to display for this patient.   History reviewed. No pertinent surgical history.  Prior to Admission medications   Medication Sig Start Date End Date Taking? Authorizing Provider  benzonatate (TESSALON) 200 MG capsule TAKE ONE CAPSULE BY MOUTH THREE TIMES A DAY AS NEEDED FOR COUGH 09/08/17   [provider]  meloxicam (MOBIC) 15 MG tablet TAKE 1 TABLET BY MOUTH EVERY DAY 09/29/17   Helane GuntherMayer, Gregory, DPM  pantoprazole (PROTONIX) 40 MG tablet  08/27/17   [provider]    Allergies  Allergen Reactions  . Penicillins Hives    No family history on file.  Social History Social History   Tobacco Use  . Smoking status: Never Smoker  . Smokeless tobacco: Never Used  Substance Use Topics  . Alcohol use: No    Frequency: Never  . Drug use: No    Review of  Systems Constitutional: Negative for fever. Cardiovascular: Negative for chest pain. Respiratory: Negative for shortness of breath. Gastrointestinal: Negative for abdominal pain, vomiting Genitourinary: Negative for urinary compaints Musculoskeletal: Negative for musculoskeletal complaints Skin: Negative for skin complaints  Neurological: Negative for headache All other ROS negative  ____________________________________________   PHYSICAL EXAM:  VITAL SIGNS: ED Triage Vitals [08/15/18 0849]  Enc Vitals Group     BP (!) 142/67     Pulse Rate 75     Resp 18     Temp 98 F (36.7 C)     Temp Source Oral     SpO2 99 %     Weight 220 lb (99.8 kg)     Height 5\' 4"  (1.626 m)     Head Circumference      Peak Flow      Pain Score 0     Pain Loc      Pain Edu?      Excl. in GC?    Constitutional: Alert and oriented. Well appearing and in no distress. Eyes: Normal exam ENT   Head: Normocephalic and atraumatic.   Mouth/Throat: Mucous membranes are moist. Cardiovascular: Normal rate, regular rhythm. No murmur Respiratory: Normal respiratory effort without tachypnea nor retractions. Breath sounds are clear  Gastrointestinal: Soft and nontender. No distention.  Musculoskeletal: Nontender with normal range of motion in all extremities. No lower extremity tenderness or edema. Neurologic:  Normal speech and language. No gross focal neurologic deficits Skin:  Skin is warm,  dry and intact.  Psychiatric: Mood and affect are normal.   ____________________________________________    EKG  EKG viewed and interpreted by myself shows a normal sinus rhythm at 64 bpm with a narrow QRS, normal axis, normal intervals, no ST changes.  Normal EKG.  ____________________________________________   INITIAL IMPRESSION / ASSESSMENT AND PLAN / ED COURSE  Pertinent labs & imaging results that were available during my care of the patient were reviewed by me and considered in my medical  decision making (see chart for details).  Patient presents to the emergency department for a flushing sensation around 830 this morning.  According to the patient she was driving and felt flushed, went home took her blood pressure medications and came to the emergency department for evaluation.  Patient states she has had a similar sensation in the past with hypertension, states when her blood pressure was 142/67 here she knew that that must of been the problem.  Patient's troponin and repeat troponin are negative.  Overall the patient continues to appear very well, no distress.  We will discharge with PCP follow-up.  ____________________________________________   FINAL CLINICAL IMPRESSION(S) / ED DIAGNOSES  Hypertension   Minna Antis, MD 08/15/18 1146

## 2018-08-15 NOTE — ED Triage Notes (Signed)
States with her age she just wants to make sure she's not having a heart attack, denies cp at this time.

## 2018-08-15 NOTE — ED Notes (Signed)
Patient states she got up around 6am and then around 8:30am patient states she felt flushed,dizzy, light headed and had a strange sensation that began in her feet and radiates into her head.  Patient states she has had this feeling before, about 1/year.  Patient states she has not seen a provider for this feeling before.  Patient reports recent weight gain and states she thinks she may have eaten more salt than normal yesterday.  Patient states feeling lasted about 1 minute but states she is still feeling somewhat dizzy and weak.

## 2019-03-07 IMAGING — MG DIGITAL SCREENING BILATERAL MAMMOGRAM WITH TOMO AND CAD
8 series · 8 of 24 positions shown · non-contrast
Comparison: Previous exam(s).

CLINICAL DATA: Screening.

EXAM:
DIGITAL SCREENING BILATERAL MAMMOGRAM WITH TOMO AND CAD

[R CC synth-2D]
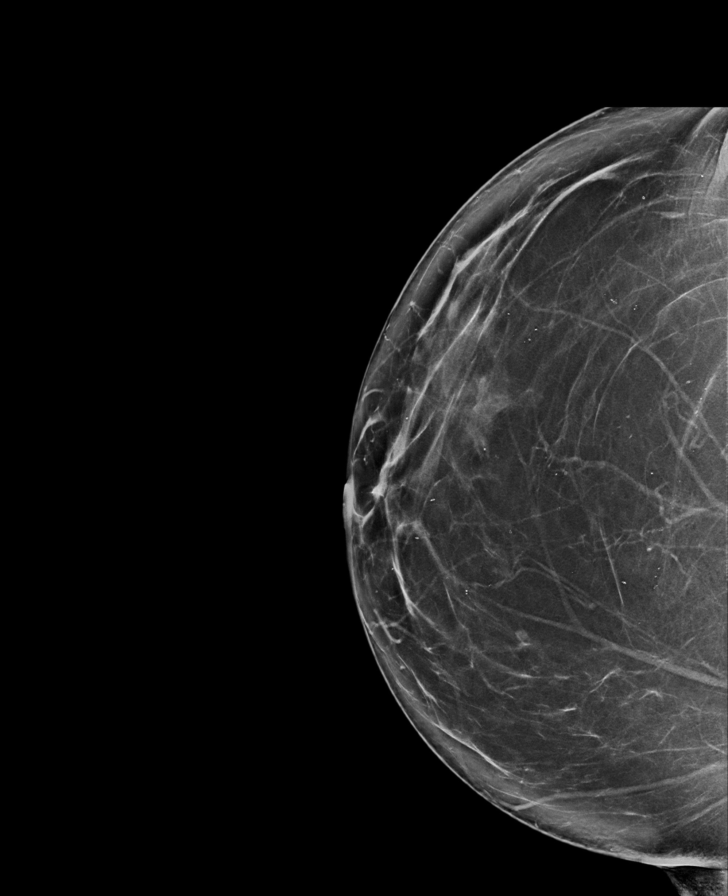

[L MLO synth-2D]
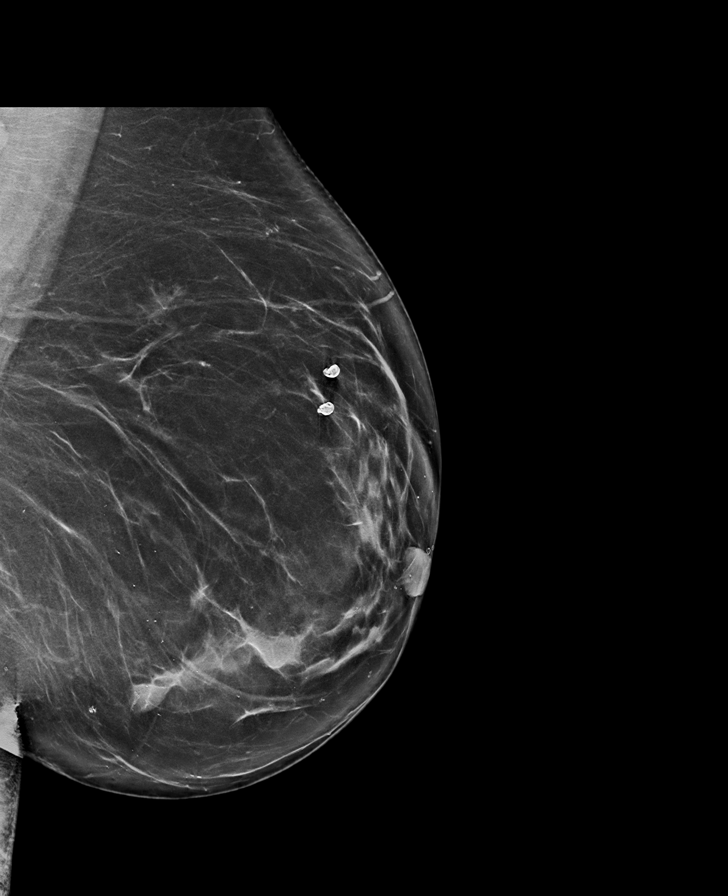

[L CC synth-2D]
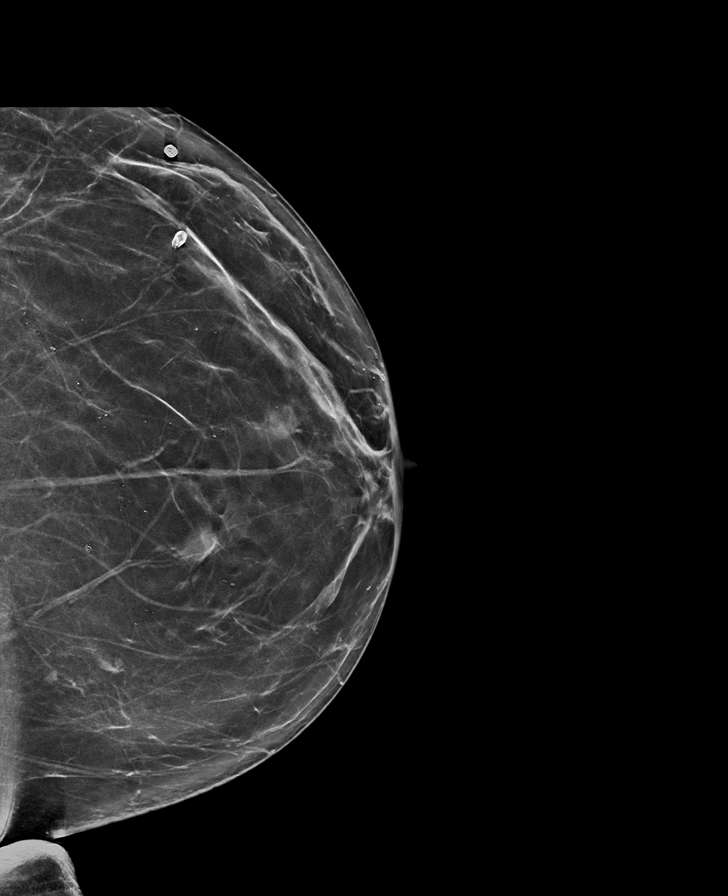

[R MLO synth-2D]
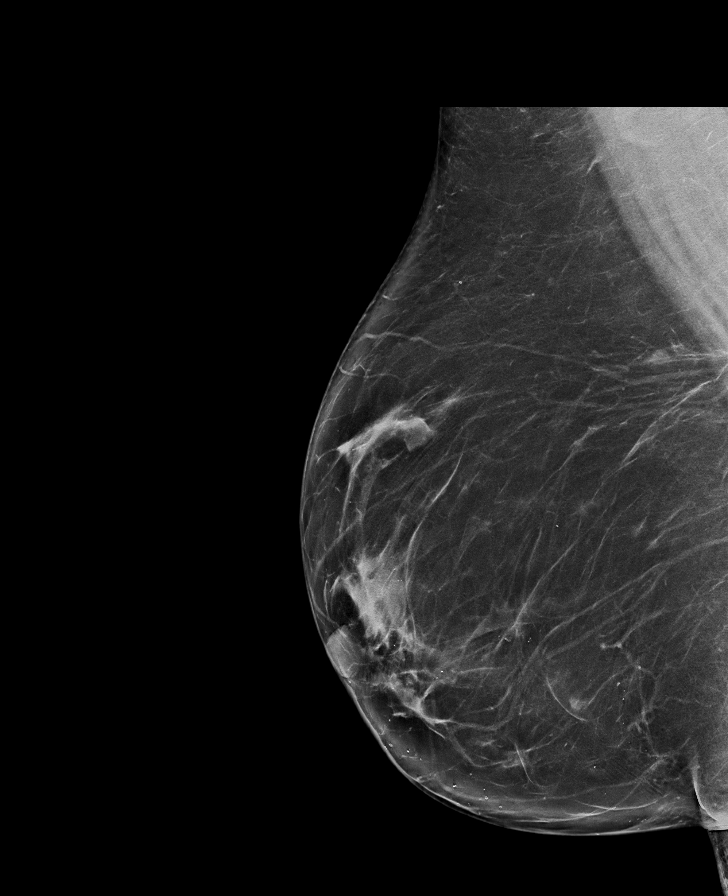

[L MLO tomo · tomo slice 43/86.0]
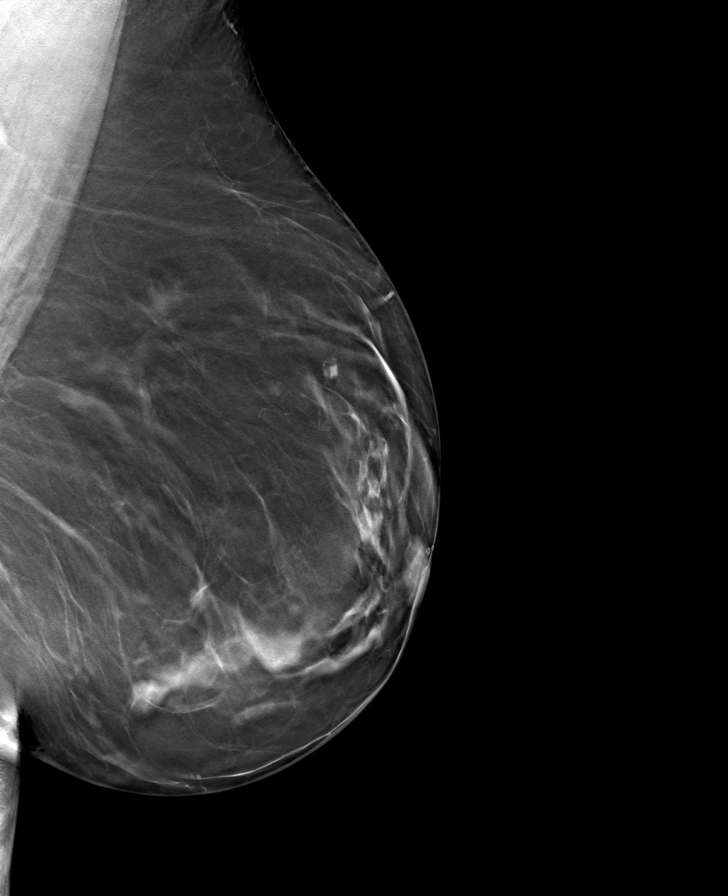

[L CC tomo · tomo slice 38/75.0]
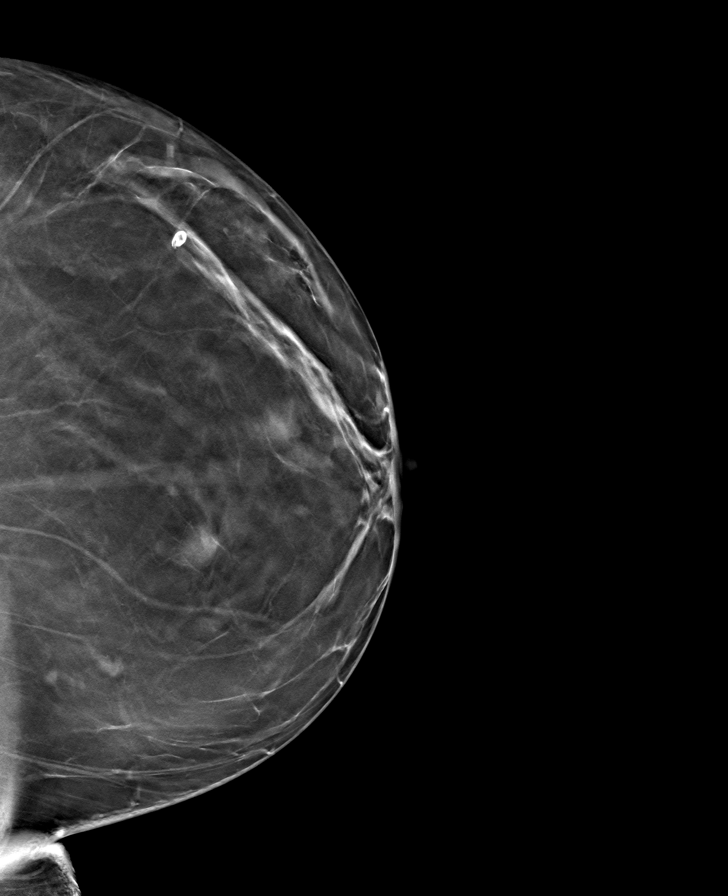

[R MLO tomo · tomo slice 46/91.0]
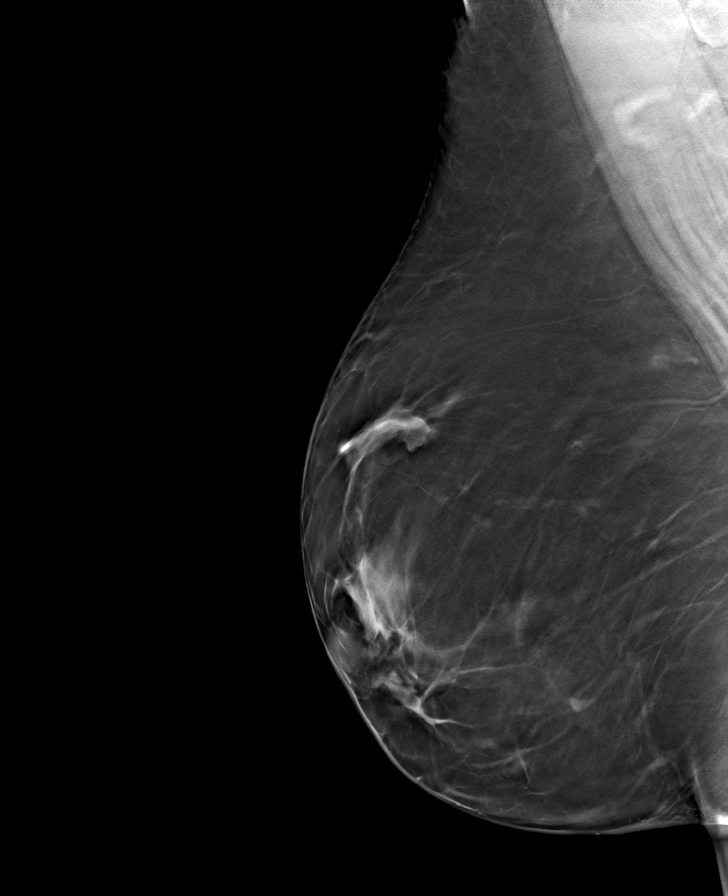

[R CC tomo · tomo slice 41/82.0]
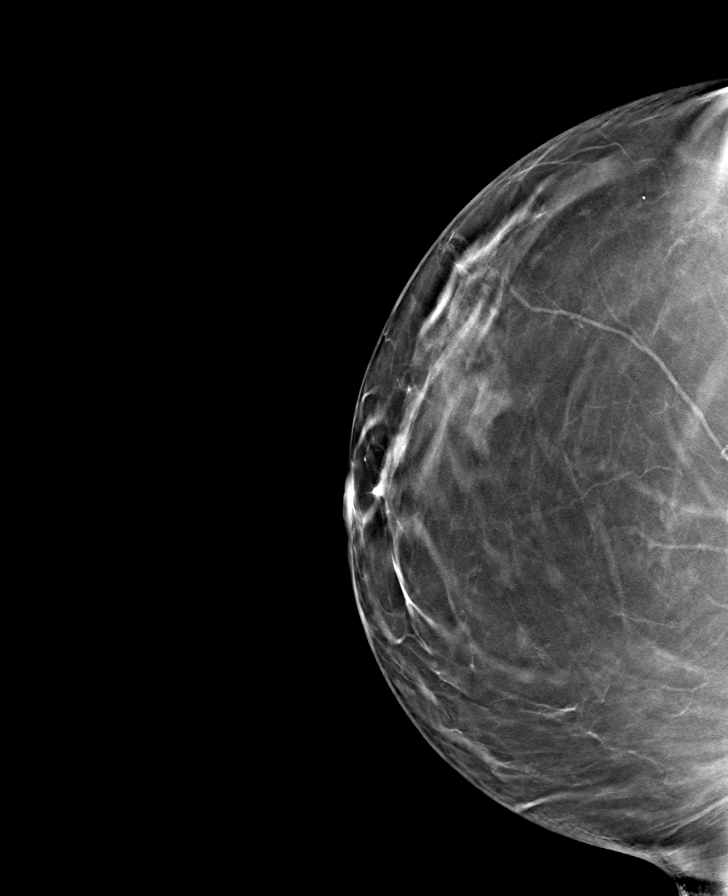

[8 of 24 positions shown; findings below may reference images not displayed]

ACR Breast Density Category b: There are scattered areas of
fibroglandular density.
FINDINGS: There are no findings suspicious for malignancy. Images were
processed with CAD.
IMPRESSION: No mammographic evidence of malignancy. A result letter of this
screening mammogram will be mailed directly to the patient.

RECOMMENDATION:
Screening mammogram in one year. (Code:[id number])

BI-RADS CATEGORY  1: Negative.

## 2020-04-02 ENCOUNTER — Other Ambulatory Visit: Payer: Self-pay

## 2020-04-02 ENCOUNTER — Emergency Department
Admission: EM | Admit: 2020-04-02 | Discharge: 2020-04-02 | Disposition: A | Discharge status: Home / Self Care | Payer: Managed Care, Other (non HMO) | Attending: Emergency Medicine | Admitting: Emergency Medicine

## 2020-04-02 ENCOUNTER — Encounter: Payer: Self-pay | Admitting: Emergency Medicine

## 2020-04-02 DIAGNOSIS — Z5321 Procedure and treatment not carried out due to patient leaving prior to being seen by health care provider: Secondary | ICD-10-CM | POA: Diagnosis not present

## 2020-04-02 DIAGNOSIS — R002 Palpitations: Secondary | ICD-10-CM | POA: Insufficient documentation

## 2020-04-02 DIAGNOSIS — R079 Chest pain, unspecified: Secondary | ICD-10-CM | POA: Diagnosis present

## 2020-04-02 HISTORY — DX: Panic disorder (episodic paroxysmal anxiety): F41.0

## 2020-04-02 HISTORY — DX: Gastro-esophageal reflux disease without esophagitis: K21.9

## 2020-04-02 HISTORY — DX: Essential (primary) hypertension: I10

## 2020-04-02 NOTE — ED Notes (Signed)
States she took a Xanax   Feels better    States she is pain free and wants to leave

## 2020-04-02 NOTE — ED Triage Notes (Signed)
Presents with chest pain and felt like her heart was racing  Hx of panic attacks  States she just didn't feel well this am

## 2020-10-28 ENCOUNTER — Encounter: Payer: Self-pay | Admitting: General Practice

## 2020-11-17 DIAGNOSIS — E559 Vitamin D deficiency, unspecified: Secondary | ICD-10-CM | POA: Insufficient documentation

## 2020-11-17 DIAGNOSIS — I10 Essential (primary) hypertension: Secondary | ICD-10-CM | POA: Insufficient documentation

## 2020-11-17 DIAGNOSIS — E669 Obesity, unspecified: Secondary | ICD-10-CM | POA: Insufficient documentation

## 2020-11-17 DIAGNOSIS — F4329 Adjustment disorder with other symptoms: Secondary | ICD-10-CM | POA: Insufficient documentation

## 2020-11-17 DIAGNOSIS — I2510 Atherosclerotic heart disease of native coronary artery without angina pectoris: Secondary | ICD-10-CM

## 2020-11-17 DIAGNOSIS — J309 Allergic rhinitis, unspecified: Secondary | ICD-10-CM | POA: Insufficient documentation

## 2020-11-17 DIAGNOSIS — K219 Gastro-esophageal reflux disease without esophagitis: Secondary | ICD-10-CM | POA: Insufficient documentation

## 2020-11-17 DIAGNOSIS — F410 Panic disorder [episodic paroxysmal anxiety] without agoraphobia: Secondary | ICD-10-CM

## 2020-11-17 HISTORY — DX: Panic disorder (episodic paroxysmal anxiety): F41.0

## 2020-11-17 HISTORY — DX: Atherosclerotic heart disease of native coronary artery without angina pectoris: I25.10

## 2020-12-09 ENCOUNTER — Ambulatory Visit: Payer: Managed Care, Other (non HMO) | Admitting: Internal Medicine

## 2020-12-09 ENCOUNTER — Encounter: Payer: Self-pay | Admitting: Internal Medicine

## 2020-12-09 ENCOUNTER — Other Ambulatory Visit: Payer: Self-pay

## 2020-12-09 VITALS — BP 122/72 | HR 60 | Ht 64.0 in | Wt 214.8 lb

## 2020-12-09 DIAGNOSIS — Z7189 Other specified counseling: Secondary | ICD-10-CM

## 2020-12-09 DIAGNOSIS — F410 Panic disorder [episodic paroxysmal anxiety] without agoraphobia: Secondary | ICD-10-CM

## 2020-12-09 DIAGNOSIS — K219 Gastro-esophageal reflux disease without esophagitis: Secondary | ICD-10-CM

## 2020-12-09 DIAGNOSIS — I10 Essential (primary) hypertension: Secondary | ICD-10-CM

## 2020-12-09 DIAGNOSIS — E785 Hyperlipidemia, unspecified: Secondary | ICD-10-CM | POA: Diagnosis not present

## 2020-12-09 NOTE — Patient Instructions (Signed)
Medication Instructions:  The current medical regimen is effective;  continue present plan and medications.  *If you need a refill on your cardiac medications before your next appointment, please call your pharmacy*   Lab Work: LIPID (come back fasting- nothing to eat or drink, no lab appointment needed)   If you have labs (blood work) drawn today and your tests are completely normal, you will receive your results only by: Marland Kitchen MyChart Message (if you have MyChart) OR . A paper copy in the mail If you have any lab test that is abnormal or we need to change your treatment, we will call you to review the results.   Testing/Procedures: *please call us to schedule ECHOCARDIOGRAM and CORONARY CALCIUM SCORE*   Follow-Up: At [facility], you and your health needs are our priority.  As part of our continuing mission to provide you with exceptional heart care, we have created designated Provider Care Teams.  These Care Teams include your primary Cardiologist (physician) and Advanced Practice Providers (APPs -  Physician Assistants and Nurse Practitioners) who all work together to provide you with the care you need, when you need it.  We recommend signing up for the patient portal called "MyChart".  Sign up information is provided on this After Visit Summary.  MyChart is used to connect with patients for Virtual Visits (Telemedicine).  Patients are able to view lab/test results, encounter notes, upcoming appointments, etc.  Non-urgent messages can be sent to your provider as well.   To learn more about what you can do with MyChart, go to [email or web address].    Your next appointment:   6 month(s)  The format for your next appointment:   In Person  Provider:   Weston Brass, MD

## 2020-12-09 NOTE — Progress Notes (Addendum)
 Cardiology Office Note:    Date:  12/09/2020   ID:  Kristi Baker, DOB March 06, 1961, MRN [id number]  PCP:  Jimmey Mould, MD  Cardiologist:  None  Electrophysiologist:  None   Referring MD: Jimmey Mould, MD   Chief Complaint/Reason for Referral: Evaluation of CAD, HTN  History of Present Illness:    Kristi Baker is a 60 y.o. female with a history of GERD, panic attacks, HTN, obesity, Prediabetic, Essential (primary) hypertension.  Today, she reports she is here as a precaution after witnessing a coworker suffer a massive stroke. She confirms anxiety and panic attacks, for which she takes Xanax particularly for driving to work. Occasionally she does get LE pain and coldness, however she thinks this may be due to her sciatica, bunions, and carpal tunnel. Her at home blood pressure is stable and well controlled. In the past she had one episode of a black-out experience due to high blood pressure. Her blood pressure medication does help her. However, she knows she still needs to monitor her salt intake. The lowest she has seen her heart rate is 46. She has been told that she occasionally snores. She denies any chest pain, shortness of breath, or palpitations. No lightheadedness/dizziness to note. Also has no lower extremity edema, orthopnea or PND.  She does not get exertional shortness of breath. She tries to walk but does not walk often or participate in formal exercise due to her bunions causing foot pain. She does like to dance.  She has never been a smoker. However, she does occasionally drink alcohol on the weekends due to stress. Her father did not develop CHF until he was in his 9's, and died at 62 yo. No known family history of heart attack prior to 62 yo.  Past Medical History:  Diagnosis Date   GERD (gastroesophageal reflux disease)    Hypertension    Panic attack    Panic attacks 11/17/2020    No past surgical history on file.  Current  Medications: Current Meds  Medication Sig   ALPRAZolam (XANAX) 0.25 MG tablet TAKE 1 TABLET BY MOUTH TWICE A DAY AS NEEDED FOR ANXIETY *MUST LAST 30 DAYS   Cholecalciferol (VITAMIN D3) 125 MCG (5000 UT) CHEW See admin instructions.   cyanocobalamin (,VITAMIN B-12,) 1000 MCG/ML injection Inject into the muscle.   lisinopril-hydrochlorothiazide (ZESTORETIC) 20-12.5 MG tablet 1 tablet   meclizine (ANTIVERT) 25 MG tablet Take 25 mg by mouth 3 (three) times daily as needed.   metFORMIN (GLUCOPHAGE) 500 MG tablet 1 tablet with a meal   pantoprazole (PROTONIX) 40 MG tablet    [DISCONTINUED] benzonatate (TESSALON) 200 MG capsule TAKE ONE CAPSULE BY MOUTH THREE TIMES A DAY AS NEEDED FOR COUGH   [DISCONTINUED] meloxicam  (MOBIC ) 15 MG tablet TAKE 1 TABLET BY MOUTH EVERY DAY     Allergies:   Penicillins   Social History   Tobacco Use   Smoking status: Never Smoker   Smokeless tobacco: Never Used  Substance Use Topics   Alcohol use: No   Drug use: No     Family History: The patient's family history is not on file.  ROS:   Please see the history of present illness.    (+) Anxiety (+) Panic attacks (+) LE pain/coldness (+) Near-syncope black-out (+) Foot pain (+) Bunions (+) Snoring All other systems reviewed and are negative.  EKGs/Labs/Other Studies Reviewed:    The following studies were reviewed today:  US  Venous Imaging Unilateral Right 09/27/2001: FINDINGS  CLINICAL  DATA:   RIGHT LEG SWELLING AND PAIN.  EVALUATE FOR DVT.  ULTRASOUND VENOUS IMAGING UNILATERAL RIGHT  REAL-TIME COMPRESSIVE GRAY SCALE AND DOPPLER ULTRASOUND EVALUATION OF THE RIGHT LOWER EXTREMITY  DEEP VEINS WAS PERFORMED FROM THE LEVEL OF THE COMMON FEMORAL VEIN THROUGH THE POPLITEAL VEIN.  COMPLETE COMPRESSIBILITY OF THE RIGHT LOWER EXTREMITY DEEP VEINS IS DEMONSTRATED.  DOPPLER  ULTRASOUND SHOWS NORMAL VENOUS FLOW DIRECTION, AS WELL AS NORMAL RESPIRATORY PHASICITY AND RESPONSE  TO AUGMENTATION.   IMPRESSION   NORMAL STUDY.  NO EVIDENCE OF RIGHT LOWER EXTREMITY DVT.  EKG:   12/09/2020: Sinus rhythm, Sinus arrhythmia, Rate 60 bpm  Recent Labs: No results found for requested labs within last 8760 hours.  Recent Lipid Panel No results found for: CHOL, TRIG, HDL, CHOLHDL, VLDL, LDLCALC, LDLDIRECT  Physical Exam:    VS:  BP 122/72   Pulse 60   Ht 5' 4 (1.626 m)   Wt 214 lb 12.8 oz (97.4 kg)   SpO2 98%   BMI 36.87 kg/m     Wt Readings from Last 5 Encounters:  12/09/20 214 lb 12.8 oz (97.4 kg)  08/15/18 220 lb (99.8 kg)    Constitutional: No acute distress Eyes: sclera non-icteric, normal conjunctiva and lids ENMT: normal dentition, moist mucous membranes Cardiovascular: regular rhythm, normal rate, no murmurs. S1 and S2 normal. Radial pulses normal bilaterally. No jugular venous distention.  Respiratory: clear to auscultation bilaterally GI : normal bowel sounds, soft and nontender. No distention.   MSK: extremities warm, well perfused. No edema.  NEURO: grossly nonfocal exam, moves all extremities. PSYCH: alert and oriented x 3, normal mood and affect.   ASSESSMENT:    1. Essential hypertension   2. Hyperlipidemia, unspecified hyperlipidemia type   3. Gastro-esophageal reflux disease without esophagitis   4. Panic attacks   5. Cardiac risk counseling    PLAN:    Essential hypertension - Plan: EKG 12-Lead -Blood pressure is currently well controlled on lisinopril HCTZ.  Previously much higher blood pressure which actually led to a syncopal episode.  But with weight loss this has improved.  No changes to medication therapy today.  Hyperlipidemia, unspecified hyperlipidemia type - Plan: Lipid panel -LDL not at goal on last lipid panel in September 2021, LDL 117.  We will recheck lipids when fasting.  I also recommended coronary calcium scan for risk stratification, she will call us  and discussed this with her husband if she would like to proceed.  Gastro-esophageal reflux  disease without esophagitis- previously had chest pain which resolved with starting pantoprazole, she believes it is related to GERD, I concur.  Panic attacks-well controlled with as needed Xanax.  Cardiac risk counseling-I recommended coronary calcium CT and echocardiogram for further risk stratification of hyperlipidemia and at least 10 years of hypertension which at one point was severe.  She will call us  to schedule these tests.   Grady Lawman, MD, [facility] [place]  [facility]    Medication Adjustments/Labs and Tests Ordered: Current medicines are reviewed at length with the patient today.  Concerns regarding medicines are outlined above.   Orders Placed This Encounter  Procedures   Lipid panel   EKG 12-Lead    No orders of the defined types were placed in this encounter.   Patient Instructions  Medication Instructions:  The current medical regimen is effective;  continue present plan and medications.  *If you need a refill on your cardiac medications before your next appointment, please call your pharmacy*   Lab Work:  LIPID (come back fasting- nothing to eat or drink, no lab appointment needed)   If you have labs (blood work) drawn today and your tests are completely normal, you will receive your results only by: MyChart Message (if you have MyChart) OR A paper copy in the mail If you have any lab test that is abnormal or we need to change your treatment, we will call you to review the results.   Testing/Procedures: *please call us  to schedule ECHOCARDIOGRAM and CORONARY CALCIUM SCORE*   Follow-Up: At [facility], you and your health needs are our priority.  As part of our continuing mission to provide you with exceptional heart care, we have created designated Provider Care Teams.  These Care Teams include your primary Cardiologist (physician) and Advanced Practice Providers (APPs -  Physician Assistants and Nurse Practitioners) who all work together to  provide you with the care you need, when you need it.  We recommend signing up for the patient portal called MyChart.  Sign up information is provided on this After Visit Summary.  MyChart is used to connect with patients for Virtual Visits (Telemedicine).  Patients are able to view lab/test results, encounter notes, upcoming appointments, etc.  Non-urgent messages can be sent to your provider as well.   To learn more about what you can do with MyChart, go to [email or web address].    Your next appointment:   6 month(s)  The format for your next appointment:   In Person  Provider:   Grady Lawman, MD         [facility],acting as a scribe for Euell Herrlich, MD.,have documented all relevant documentation on the behalf of Euell Herrlich, MD,as directed by  Euell Herrlich, MD while in the presence of Euell Herrlich, MD.  I, Euell Herrlich, MD, have reviewed all documentation for this visit. The documentation on 12/09/20 for the exam, diagnosis, procedures, and orders are all accurate and complete.  Addendum to note placed 01/20/24 per patient request to remove diagnosis of CAD that was per patient erroneously included in her past medical history.

## 2023-12-14 ENCOUNTER — Ambulatory Visit: Admitting: Podiatry

## 2023-12-14 DIAGNOSIS — M722 Plantar fascial fibromatosis: Secondary | ICD-10-CM

## 2023-12-14 NOTE — Progress Notes (Signed)
  Subjective:  Patient ID: Kristi Baker, female    DOB: 21-Feb-1961,  MRN: [id number]  Chief Complaint  Patient presents with   Plantar Fasciitis    Pt stated that her plantar fasciitis is bothering her again pain is in the heel     63 y.o. female presents with the above complaint.  Patient presents with right heel pain that came out of nowhere is causing a lot of issues worse with ambulation and shoe pressure she has not seen MRIs prior to seeing me pain scale 7 out of 10 dull aching nature hurts with taking for step in the morning.  Denies any other acute issues.   Review of Systems: Negative except as noted in the HPI. Denies N/V/F/Ch.  Past Medical History:  Diagnosis Date   CAD (coronary artery disease) 11/17/2020   GERD (gastroesophageal reflux disease)    Hypertension    Panic attack    Panic attacks 11/17/2020    Current Outpatient Medications:    ALPRAZolam (XANAX) 0.25 MG tablet, TAKE 1 TABLET BY MOUTH TWICE A DAY AS NEEDED FOR ANXIETY *MUST LAST 30 DAYS, Disp: , Rfl:    Cholecalciferol (VITAMIN D3) 125 MCG (5000 UT) CHEW, See admin instructions., Disp: , Rfl:    cyanocobalamin (,VITAMIN B-12,) 1000 MCG/ML injection, Inject into the muscle., Disp: , Rfl:    lisinopril-hydrochlorothiazide (ZESTORETIC) 20-12.5 MG tablet, 1 tablet, Disp: , Rfl:    meclizine (ANTIVERT) 25 MG tablet, Take 25 mg by mouth 3 (three) times daily as needed., Disp: , Rfl:    metFORMIN (GLUCOPHAGE) 500 MG tablet, 1 tablet with a meal, Disp: , Rfl:    pantoprazole (PROTONIX) 40 MG tablet, , Disp: , Rfl:   Social History   Tobacco Use  Smoking Status Never  Smokeless Tobacco Never    Allergies  Allergen Reactions   Penicillins Hives   Objective:  There were no vitals filed for this visit. There is no height or weight on file to calculate BMI. Constitutional Well developed. Well nourished.  Vascular Dorsalis pedis pulses palpable bilaterally. Posterior tibial pulses palpable  bilaterally. Capillary refill normal to all digits.  No cyanosis or clubbing noted. Pedal hair growth normal.  Neurologic Normal speech. Oriented to person, place, and time. Epicritic sensation to light touch grossly present bilaterally.  Dermatologic Nails well groomed and normal in appearance. No open wounds. No skin lesions.  Orthopedic: Normal joint ROM without pain or crepitus bilaterally. No visible deformities. Tender to palpation at the calcaneal tuber right. No pain with calcaneal squeeze right. Ankle ROM diminished range of motion right. Silfverskiold Test: positive right.   Radiographs: None  Assessment:   1. Plantar fasciitis, right    Plan:  Patient was evaluated and treated and all questions answered.  Plantar Fasciitis, right - XR reviewed as above.  - Educated on icing and stretching. Instructions given.  - Injection delivered to the plantar fascia as below. - DME: Plantar fascial brace dispensed to support the medial longitudinal arch of the foot and offload pressure from the heel and prevent arch collapse during weightbearing - Pharmacologic management: None  Procedure: Injection Tendon/Ligament Location: Right plantar fascia at the glabrous junction; medial approach. Skin Prep: alcohol Injectate: 0.5 cc 0.5% marcaine plain, 0.5 cc of 1% Lidocaine, 0.5 cc kenalog  10. Disposition: Patient tolerated procedure well. Injection site dressed with a band-aid.  No follow-ups on file.

## 2024-01-13 ENCOUNTER — Ambulatory Visit (INDEPENDENT_AMBULATORY_CARE_PROVIDER_SITE_OTHER): Admitting: Podiatry

## 2024-01-13 DIAGNOSIS — M62461 Contracture of muscle, right lower leg: Secondary | ICD-10-CM

## 2024-01-13 DIAGNOSIS — M722 Plantar fascial fibromatosis: Secondary | ICD-10-CM

## 2024-01-13 NOTE — Progress Notes (Unsigned)
  Subjective:  Patient ID: Kristi Baker, female    DOB: Feb 25, 1961,  MRN: [id number]  Chief Complaint  Patient presents with   Plantar Fasciitis    Pt stated that she has some improvement but still feels some pressure    63 y.o. female presents with the above complaint.  Patient presents for follow-up of right plantar fasciitis.  She has some improvement but still feels some pressures.  Denies any other acute complaints   Review of Systems: Negative except as noted in the HPI. Denies N/V/F/Ch.  Past Medical History:  Diagnosis Date   CAD (coronary artery disease) 11/17/2020   GERD (gastroesophageal reflux disease)    Hypertension    Panic attack    Panic attacks 11/17/2020    Current Outpatient Medications:    ALPRAZolam (XANAX) 0.25 MG tablet, TAKE 1 TABLET BY MOUTH TWICE A DAY AS NEEDED FOR ANXIETY *MUST LAST 30 DAYS, Disp: , Rfl:    Cholecalciferol (VITAMIN D3) 125 MCG (5000 UT) CHEW, See admin instructions., Disp: , Rfl:    cyanocobalamin (,VITAMIN B-12,) 1000 MCG/ML injection, Inject into the muscle., Disp: , Rfl:    lisinopril-hydrochlorothiazide (ZESTORETIC) 20-12.5 MG tablet, 1 tablet, Disp: , Rfl:    meclizine (ANTIVERT) 25 MG tablet, Take 25 mg by mouth 3 (three) times daily as needed., Disp: , Rfl:    metFORMIN (GLUCOPHAGE) 500 MG tablet, 1 tablet with a meal, Disp: , Rfl:    pantoprazole (PROTONIX) 40 MG tablet, , Disp: , Rfl:   Social History   Tobacco Use  Smoking Status Never  Smokeless Tobacco Never    Allergies  Allergen Reactions   Penicillins Hives   Objective:  There were no vitals filed for this visit. There is no height or weight on file to calculate BMI. Constitutional Well developed. Well nourished.  Vascular Dorsalis pedis pulses palpable bilaterally. Posterior tibial pulses palpable bilaterally. Capillary refill normal to all digits.  No cyanosis or clubbing noted. Pedal hair growth normal.  Neurologic Normal speech. Oriented to  person, place, and time. Epicritic sensation to light touch grossly present bilaterally.  Dermatologic Nails well groomed and normal in appearance. No open wounds. No skin lesions.  Orthopedic: Normal joint ROM without pain or crepitus bilaterally. No visible deformities. Tender to palpation at the calcaneal tuber right. No pain with calcaneal squeeze right. Ankle ROM diminished range of motion right. Silfverskiold Test: positive right.   Radiographs: None  Assessment:   1. Plantar fasciitis, right   2. Gastrocnemius equinus of right lower extremity     Plan:  Patient was evaluated and treated and all questions answered.  Plantar Fasciitis, right with underlying gastrocnemius equinus - XR reviewed as above.  - Educated on icing and stretching. Instructions given.  - Second injection delivered to the plantar fascia as below. - DME: Plantar fascial brace dispensed to support the medial longitudinal arch of the foot and offload pressure from the heel and prevent arch collapse during weightbearing - Pharmacologic management: None  Procedure: Injection Tendon/Ligament Location: Right plantar fascia at the glabrous junction; medial approach. Skin Prep: alcohol Injectate: 0.5 cc 0.5% marcaine plain, 0.5 cc of 1% Lidocaine, 0.5 cc kenalog  10. Disposition: Patient tolerated procedure well. Injection site dressed with a band-aid.  No follow-ups on file.

## 2024-01-18 DIAGNOSIS — E559 Vitamin D deficiency, unspecified: Secondary | ICD-10-CM | POA: Diagnosis not present

## 2024-01-18 DIAGNOSIS — I10 Essential (primary) hypertension: Secondary | ICD-10-CM | POA: Diagnosis not present

## 2024-01-18 DIAGNOSIS — J069 Acute upper respiratory infection, unspecified: Secondary | ICD-10-CM | POA: Diagnosis not present

## 2024-01-18 DIAGNOSIS — K219 Gastro-esophageal reflux disease without esophagitis: Secondary | ICD-10-CM | POA: Diagnosis not present

## 2024-01-18 DIAGNOSIS — R7303 Prediabetes: Secondary | ICD-10-CM | POA: Diagnosis not present

## 2024-01-18 DIAGNOSIS — F419 Anxiety disorder, unspecified: Secondary | ICD-10-CM | POA: Diagnosis not present

## 2024-01-18 DIAGNOSIS — F410 Panic disorder [episodic paroxysmal anxiety] without agoraphobia: Secondary | ICD-10-CM | POA: Diagnosis not present

## 2024-01-20 ENCOUNTER — Encounter: Payer: Self-pay | Admitting: Internal Medicine

## 2024-01-25 ENCOUNTER — Telehealth: Admitting: Physician Assistant

## 2024-01-25 DIAGNOSIS — R058 Other specified cough: Secondary | ICD-10-CM | POA: Diagnosis not present

## 2024-01-25 DIAGNOSIS — J302 Other seasonal allergic rhinitis: Secondary | ICD-10-CM | POA: Diagnosis not present

## 2024-01-25 MED ORDER — BENZONATATE 100 MG PO CAPS: 100.0000 mg | ORAL_CAPSULE | Freq: Three times a day (TID) | ORAL | 0 refills | Status: DC | PRN

## 2024-01-25 NOTE — Progress Notes (Signed)
 I have spent 5 minutes in review of e-visit questionnaire, review and updating patient chart, medical decision making and response to patient.   Piedad Climes, PA-C

## 2024-01-25 NOTE — Progress Notes (Signed)
 E visit for Allergic Rhinitis We are sorry that you are not feeling well.  Here is how we plan to help!  Based on what you have shared with me it looks like you have Allergic Rhinitis.  Rhinitis is when a reaction occurs that causes nasal congestion, runny nose, sneezing, and itching.  Most types of rhinitis are caused by an inflammation and are associated with symptoms in the eyes ears or throat. There are several types of rhinitis.  The most common are acute rhinitis, which is usually caused by a viral illness, allergic or seasonal rhinitis, and nonallergic or year-round rhinitis.  Nasal allergies occur certain times of the year.  Allergic rhinitis is caused when allergens in the air trigger the release of histamine in the body.  Histamine causes itching, swelling, and fluid to build up in the fragile linings of the nasal passages, sinuses and eyelids.  An itchy nose and clear discharge are common.  I recommend the following over the counter treatments: You should take a daily dose of antihistamine -- I recommend switching to Xyzal OTC 5 mg -  take 1 tablet daily  I also would recommend a nasal spray: Saline 1 spray into each nostril as needed in addition to your nasal steroid spray.  I have also sent in a prescription cough medication, Tessalon, to take as directed along with OTC Mucinex.  HOME CARE:  You can use an over-the-counter saline nasal spray as needed Avoid areas where there is heavy dust, mites, or molds Stay indoors on windy days during the pollen season Keep windows closed in home, at least in bedroom; use air conditioner. Use high-efficiency house air filter Keep windows closed in car, turn AC on re-circulate Avoid playing out with dog during pollen season  GET HELP RIGHT AWAY IF:  If your symptoms do not improve within 10 days You become short of breath You develop yellow or green discharge from your nose for over 3 days You have coughing fits  MAKE SURE  YOU:  Understand these instructions Will watch your condition Will get help right away if you are not doing well or get worse  Thank you for choosing an e-visit. Your e-visit answers were reviewed by a board certified advanced clinical practitioner to complete your personal care plan. Depending upon the condition, your plan could have included both over the counter or prescription medications. Please review your pharmacy choice. Be sure that the pharmacy you have chosen is open so that you can pick up your prescription now.  If there is a problem you may message your provider in MyChart to have the prescription routed to another pharmacy. Your safety is important to us . If you have drug allergies check your prescription carefully.  For the next 24 hours, you can use MyChart to ask questions about today's visit, request a non-urgent call back, or ask for a work or school excuse from your e-visit provider. You will get an email in the next two days asking about your experience. I hope that your e-visit has been valuable and will speed your recovery.

## 2024-05-22 DIAGNOSIS — F410 Panic disorder [episodic paroxysmal anxiety] without agoraphobia: Secondary | ICD-10-CM | POA: Diagnosis not present

## 2024-05-22 DIAGNOSIS — F419 Anxiety disorder, unspecified: Secondary | ICD-10-CM | POA: Diagnosis not present

## 2024-05-22 DIAGNOSIS — K219 Gastro-esophageal reflux disease without esophagitis: Secondary | ICD-10-CM | POA: Diagnosis not present

## 2024-05-22 DIAGNOSIS — I10 Essential (primary) hypertension: Secondary | ICD-10-CM | POA: Diagnosis not present

## 2024-07-10 ENCOUNTER — Other Ambulatory Visit: Payer: Self-pay | Admitting: Family Medicine

## 2024-07-10 DIAGNOSIS — Z1231 Encounter for screening mammogram for malignant neoplasm of breast: Secondary | ICD-10-CM

## 2024-07-20 ENCOUNTER — Other Ambulatory Visit (HOSPITAL_BASED_OUTPATIENT_CLINIC_OR_DEPARTMENT_OTHER): Payer: Self-pay | Admitting: Family Medicine

## 2024-07-20 DIAGNOSIS — E2839 Other primary ovarian failure: Secondary | ICD-10-CM

## 2024-08-08 ENCOUNTER — Ambulatory Visit

## 2024-08-15 ENCOUNTER — Telehealth: Admitting: Physician Assistant

## 2024-08-15 DIAGNOSIS — J069 Acute upper respiratory infection, unspecified: Secondary | ICD-10-CM | POA: Diagnosis not present

## 2024-08-16 MED ORDER — ALBUTEROL SULFATE HFA 108 (90 BASE) MCG/ACT IN AERS: 2.0000 | INHALATION_SPRAY | Freq: Four times a day (QID) | RESPIRATORY_TRACT | 0 refills | Status: AC | PRN

## 2024-08-16 MED ORDER — BENZONATATE 100 MG PO CAPS: 100.0000 mg | ORAL_CAPSULE | Freq: Three times a day (TID) | ORAL | 0 refills | Status: AC | PRN

## 2024-08-16 NOTE — Progress Notes (Signed)

## 2024-08-17 ENCOUNTER — Ambulatory Visit

## 2024-08-30 ENCOUNTER — Ambulatory Visit

## 2024-08-30 ENCOUNTER — Telehealth: Admitting: Nurse Practitioner

## 2024-08-30 DIAGNOSIS — B9689 Other specified bacterial agents as the cause of diseases classified elsewhere: Secondary | ICD-10-CM

## 2024-08-30 DIAGNOSIS — J019 Acute sinusitis, unspecified: Secondary | ICD-10-CM

## 2024-08-30 DIAGNOSIS — R051 Acute cough: Secondary | ICD-10-CM

## 2024-08-30 MED ORDER — PROMETHAZINE HCL 12.5 MG PO TABS: 12.5000 mg | ORAL_TABLET | Freq: Three times a day (TID) | ORAL | 0 refills | Status: AC | PRN

## 2024-08-30 MED ORDER — DOXYCYCLINE HYCLATE 100 MG PO TABS: 100.0000 mg | ORAL_TABLET | Freq: Two times a day (BID) | ORAL | 0 refills | Status: AC

## 2024-08-30 NOTE — Progress Notes (Signed)
 " Virtual Visit Consent   JERSEE WINIARSKI, you are scheduled for a virtual visit with a [facility] provider today. Just as with appointments in the office, your consent must be obtained to participate. Your consent will be active for this visit and any virtual visit you may have with one of our providers in the next 365 days. If you have a MyChart account, a copy of this consent can be sent to you electronically.  As this is a virtual visit, video technology does not allow for your provider to perform a traditional examination. This may limit your provider's ability to fully assess your condition. If your provider identifies any concerns that need to be evaluated in person or the need to arrange testing (such as labs, EKG, etc.), we will make arrangements to do so. Although advances in technology are sophisticated, we cannot ensure that it will always work on either your end or our end. If the connection with a video visit is poor, the visit may have to be switched to a telephone visit. With either a video or telephone visit, we are not always able to ensure that we have a secure connection.  By engaging in this virtual visit, you consent to the provision of healthcare and authorize for your insurance to be billed (if applicable) for the services provided during this visit. Depending on your insurance coverage, you may receive a charge related to this service.  I need to obtain your verbal consent now. Are you willing to proceed with your visit today? Vernee Baines Weatherbee has provided verbal consent on 08/30/2024 for a virtual visit (video or telephone). Hadassah Fireman, NP  Date: 08/30/2024 9:46 AM   Virtual Visit via Video Note   I, Hadassah Fireman, connected with  Ayelen Sciortino Roussin  ([id number], [birth year]) on 08/30/24 at  9:15 AM EST by a video-enabled telemedicine application and verified that I am speaking with the correct person using two identifiers.  Location: Patient: Virtual Visit Location  Patient: Home Provider: Virtual Visit Location Provider: Home Office   I discussed the limitations of evaluation and management by telemedicine and the availability of in person appointments. The patient expressed understanding and agreed to proceed.    History of Present Illness: TERYN BOEREMA is a 64 y.o. who identifies as a female who was assigned female at birth, and is being seen today for ongoing viral upper respiratory symptoms x18 days. She has ongoing cough that prevents her from sleeping and has caused some blood vessels in her eye to burst. She had an eVisit done on 1/15 and was prescribed Tessalon  Perles and Albuterol  inhaler with some relief from the inhaler. Productive cough that has a color to it at times. No fevers/chills. Wheezing returned last night. Decreased appetite, with no issues holding down food or liquids when she is able to eat/drink. Denies dizziness.  Denies SOB or chest pain. States she has been trying to take the Xanax the past two nights to help with sleep due to coughing but no relief.  HPI: HPI  Problems:  Patient Active Problem List   Diagnosis Date Noted   CAD (coronary artery disease) 11/17/2020   Gastro-esophageal reflux disease without esophagitis 11/17/2020   Allergic rhinitis 11/17/2020   Panic attacks 11/17/2020   Essential hypertension 11/17/2020   Vitamin D deficiency 11/17/2020   Obesity, Class II, BMI 35-39.9, isolated (see actual BMI) 11/17/2020   Stress and adjustment reaction 11/17/2020    Allergies: Allergies[1] Medications: Current Medications[2]  Observations/Objective: Patient  is well-developed, well-nourished in no acute distress.  Resting comfortably  at home.  Head is normocephalic, atraumatic.  No labored breathing.  Speech is hoarse and coherent with logical content.  Patient is alert and oriented at baseline.    Assessment and Plan: 1. Acute bacterial sinusitis (Primary) - doxycycline  (VIBRA -TABS) 100 MG tablet; Take  1 tablet (100 mg total) by mouth 2 (two) times daily for 7 days.  Dispense: 14 tablet; Refill: 0 - promethazine  (PHENERGAN ) 12.5 MG tablet; Take 1 tablet (12.5 mg total) by mouth every 8 (eight) hours as needed (severe cough).  Dispense: 20 tablet; Refill: 0  2. Acute cough - promethazine  (PHENERGAN ) 12.5 MG tablet; Take 1 tablet (12.5 mg total) by mouth every 8 (eight) hours as needed (severe cough).  Dispense: 20 tablet; Refill: 0  Due to ongoing symptoms of viral sinusitis x18 days, based on symptoms has more likely progressed to bacterial sinusitis. Patient not in distress during visit.  Discussed that she is to stop taking tessalon  perles since they are not helping with her cough and also to not take her Xanax with the promethazine  due to increased sedation. States understanding.  Advised to increase her fluid intake for hydration.  Red flag s/s discussed for her to report to UC facility or ER for further evaluation.    Follow Up Instructions: I discussed the assessment and treatment plan with the patient. The patient was provided an opportunity to ask questions and all were answered. The patient agreed with the plan and demonstrated an understanding of the instructions.  A copy of instructions were sent to the patient via MyChart unless otherwise noted below.   Patient has requested to receive PHI (AVS, Work Notes, etc) pertaining to this video visit through e-mail as they are currently without active MyChart. They have voiced understand that email is not considered secure and their health information could be viewed by someone other than the patient.   The patient was advised to call back or seek an in-person evaluation if the symptoms worsen or if the condition fails to improve as anticipated.    Hadassah Fireman, NP      [1]  Allergies Allergen Reactions   Penicillins Hives  [2]  Current Outpatient Medications:    doxycycline  (VIBRA -TABS) 100 MG tablet, Take 1 tablet (100 mg total)  by mouth 2 (two) times daily for 7 days., Disp: 14 tablet, Rfl: 0   promethazine  (PHENERGAN ) 12.5 MG tablet, Take 1 tablet (12.5 mg total) by mouth every 8 (eight) hours as needed (severe cough)., Disp: 20 tablet, Rfl: 0   albuterol  (VENTOLIN  HFA) 108 (90 Base) MCG/ACT inhaler, Inhale 2 puffs into the lungs every 6 (six) hours as needed for wheezing or shortness of breath., Disp: 8 g, Rfl: 0   ALPRAZolam (XANAX) 0.25 MG tablet, TAKE 1 TABLET BY MOUTH TWICE A DAY AS NEEDED FOR ANXIETY *MUST LAST 30 DAYS, Disp: , Rfl:    benzonatate  (TESSALON ) 100 MG capsule, Take 1 capsule (100 mg total) by mouth 3 (three) times daily as needed for cough., Disp: 30 capsule, Rfl: 0   lisinopril-hydrochlorothiazide (ZESTORETIC) 20-12.5 MG tablet, 1 tablet, Disp: , Rfl:    pantoprazole (PROTONIX) 40 MG tablet, , Disp: , Rfl:   "

## 2024-08-30 NOTE — Patient Instructions (Signed)
 " Kristi Baker, thank you for joining Hadassah Fireman, NP for today's virtual visit.  While this provider is not your primary care provider (PCP), if your PCP is located in our provider database this encounter information will be shared with them immediately following your visit.   A [facility] MyChart account gives you access to today's visit and all your visits, tests, and labs performed at [facility]  click here if you don't have a [facility] MyChart account or go to [email or web address]  Consent: (Patient) Kristi CHRISTELLA Brull provided verbal consent for this virtual visit at the beginning of the encounter.  Current Medications:  Current Outpatient Medications:    doxycycline  (VIBRA -TABS) 100 MG tablet, Take 1 tablet (100 mg total) by mouth 2 (two) times daily for 7 days., Disp: 14 tablet, Rfl: 0   promethazine  (PHENERGAN ) 12.5 MG tablet, Take 1 tablet (12.5 mg total) by mouth every 8 (eight) hours as needed (severe cough)., Disp: 20 tablet, Rfl: 0   albuterol  (VENTOLIN  HFA) 108 (90 Base) MCG/ACT inhaler, Inhale 2 puffs into the lungs every 6 (six) hours as needed for wheezing or shortness of breath., Disp: 8 g, Rfl: 0   ALPRAZolam (XANAX) 0.25 MG tablet, TAKE 1 TABLET BY MOUTH TWICE A DAY AS NEEDED FOR ANXIETY *MUST LAST 30 DAYS, Disp: , Rfl:    benzonatate  (TESSALON ) 100 MG capsule, Take 1 capsule (100 mg total) by mouth 3 (three) times daily as needed for cough., Disp: 30 capsule, Rfl: 0   lisinopril-hydrochlorothiazide (ZESTORETIC) 20-12.5 MG tablet, 1 tablet, Disp: , Rfl:    pantoprazole (PROTONIX) 40 MG tablet, , Disp: , Rfl:    Medications ordered in this encounter:  Meds ordered this encounter  Medications   doxycycline  (VIBRA -TABS) 100 MG tablet    Sig: Take 1 tablet (100 mg total) by mouth 2 (two) times daily for 7 days.    Dispense:  14 tablet    Refill:  0    Supervising Provider:   LAMPTEY, PHILIP O [[id number]]   promethazine  (PHENERGAN ) 12.5 MG tablet     Sig: Take 1 tablet (12.5 mg total) by mouth every 8 (eight) hours as needed (severe cough).    Dispense:  20 tablet    Refill:  0    Supervising Provider:   LAMPTEY, PHILIP O [[id number]]     *If you need refills on other medications prior to your next appointment, please contact your pharmacy*  Follow-Up: Call back or seek an in-person evaluation if the symptoms worsen or if the condition fails to improve as anticipated.  [facility] [phone]  Other Instructions Please do not take the phenergan  with your Xanax as they both cause sedation.  Continue to increase your fluids for hydration promotion.  Please seek immediate care in person if your symptoms do not resolve or worsen.   If you have been instructed to have an in-person evaluation today at a local Urgent Care facility, please use the link below. It will take you to a list of all of our available [facility], including address, phone number and hours of operation. Please do not delay care.  [facility]  If you or a family member do not have a primary care provider, use the link below to schedule a visit and establish care. When you choose a  primary care physician or advanced practice provider, you gain a long-term partner in health. Find a Primary Care Provider  Learn more about  [facility] in-office and virtual care options: [place] - Get Care Now  "

## 2024-08-31 ENCOUNTER — Telehealth: Admitting: Physician Assistant

## 2024-08-31 DIAGNOSIS — J329 Chronic sinusitis, unspecified: Secondary | ICD-10-CM | POA: Diagnosis not present

## 2024-08-31 DIAGNOSIS — T50905A Adverse effect of unspecified drugs, medicaments and biological substances, initial encounter: Secondary | ICD-10-CM | POA: Diagnosis not present

## 2024-08-31 DIAGNOSIS — R112 Nausea with vomiting, unspecified: Secondary | ICD-10-CM | POA: Diagnosis not present

## 2024-08-31 DIAGNOSIS — J40 Bronchitis, not specified as acute or chronic: Secondary | ICD-10-CM | POA: Diagnosis not present

## 2024-08-31 MED ORDER — AZITHROMYCIN 250 MG PO TABS: ORAL_TABLET | ORAL | 0 refills | Status: AC

## 2024-08-31 MED ORDER — PREDNISONE 10 MG (21) PO TBPK: ORAL_TABLET | ORAL | 0 refills | Status: AC

## 2024-08-31 NOTE — Progress Notes (Signed)
 " Virtual Visit Consent   Kristi Baker, you are scheduled for a virtual visit with a [facility] provider today. Just as with appointments in the office, your consent must be obtained to participate. Your consent will be active for this visit and any virtual visit you may have with one of our providers in the next 365 days. If you have a MyChart account, a copy of this consent can be sent to you electronically.  As this is a virtual visit, video technology does not allow for your provider to perform a traditional examination. This may limit your provider's ability to fully assess your condition. If your provider identifies any concerns that need to be evaluated in person or the need to arrange testing (such as labs, EKG, etc.), we will make arrangements to do so. Although advances in technology are sophisticated, we cannot ensure that it will always work on either your end or our end. If the connection with a video visit is poor, the visit may have to be switched to a telephone visit. With either a video or telephone visit, we are not always able to ensure that we have a secure connection.  By engaging in this virtual visit, you consent to the provision of healthcare and authorize for your insurance to be billed (if applicable) for the services provided during this visit. Depending on your insurance coverage, you may receive a charge related to this service.  I need to obtain your verbal consent now. Are you willing to proceed with your visit today? [name] has provided verbal consent on 08/31/2024 for a virtual visit (video or telephone). Delon CHRISTELLA Dickinson, PA-C  Date: 08/31/2024 11:09 AM   Virtual Visit via Video Note   I, Delon CHRISTELLA Dickinson, connected with  Jackquelyn Sundberg Pontius  ([id number], [birth year]) on 08/31/24 at 11:00 AM EST by a video-enabled telemedicine application and verified that I am speaking with the correct person using two identifiers.  Location: Patient: Virtual  Visit Location Patient: Home Provider: Virtual Visit Location Provider: Home Office   I discussed the limitations of evaluation and management by telemedicine and the availability of in person appointments. The patient expressed understanding and agreed to proceed.    History of Present Illness: Kristi Baker is a 64 y.o. who identifies as a female who was assigned female at birth, and is being seen today for medication side effect. Given Doxycycline  for continued URI symptoms and cough over 2.5 weeks. Has tried 2 doses of Doxycycline  and has vomited after each dose.   Also, cough is not improving despite having Promethaizne 12.5mg  prescribed yesterday. She has also been using the albuterol  inhaler previously prescribed. She does endorse wheezing still, but does improve with Albuterol . Cough is dry and harsh. It is occurring every few minutes. Denies occult shortness of breath or difficulty breathing outside of coughing fits.   Problems:  Patient Active Problem List   Diagnosis Date Noted   CAD (coronary artery disease) 11/17/2020   Gastro-esophageal reflux disease without esophagitis 11/17/2020   Allergic rhinitis 11/17/2020   Panic attacks 11/17/2020   Essential hypertension 11/17/2020   Vitamin D deficiency 11/17/2020   Obesity, Class II, BMI 35-39.9, isolated (see actual BMI) 11/17/2020   Stress and adjustment reaction 11/17/2020    Allergies: Allergies[1] Medications: Current Medications[2]  Observations/Objective: Patient is well-developed, well-nourished in no acute distress.  Resting comfortably at home.  Head is normocephalic, atraumatic.  No labored breathing.  Speech is clear and coherent with logical content.  Patient is alert and oriented at baseline.  Frequent, dry, barking cough heard   Assessment and Plan: 1. Nausea and vomiting, unspecified vomiting type (Primary)  2. Medication side effect, initial encounter  3. Sinobronchitis - azithromycin  (ZITHROMAX )  250 MG tablet; Take 2 tablets on day 1, then 1 tablet daily on days 2 through 5  Dispense: 6 tablet; Refill: 0 - predniSONE  (STERAPRED UNI-PAK 21 TAB) 10 MG (21) TBPK tablet; 6 day taper; take as directed on package instructions  Dispense: 21 tablet; Refill: 0  - STOP Doxycycline , added to intolerance list - START Azithromycin  - Add Prednisone  for cough - Continue Promethazine  for cough and Albuterol  for wheezing - May add Mucinex, Mucinex DM, or Delsym OTC with above treatment - Steam and humidifier can help - Push fluids - Rest - Seek in person evaluation if not improving or if worsens at all  Follow Up Instructions: I discussed the assessment and treatment plan with the patient. The patient was provided an opportunity to ask questions and all were answered. The patient agreed with the plan and demonstrated an understanding of the instructions.  A copy of instructions were sent to the patient via MyChart unless otherwise noted below.    The patient was advised to call back or seek an in-person evaluation if the symptoms worsen or if the condition fails to improve as anticipated.    [name], PA-C     [1]  Allergies Allergen Reactions   Penicillins Hives  [2]  Current Outpatient Medications:    azithromycin  (ZITHROMAX ) 250 MG tablet, Take 2 tablets on day 1, then 1 tablet daily on days 2 through 5, Disp: 6 tablet, Rfl: 0   predniSONE  (STERAPRED UNI-PAK 21 TAB) 10 MG (21) TBPK tablet, 6 day taper; take as directed on package instructions, Disp: 21 tablet, Rfl: 0   albuterol  (VENTOLIN  HFA) 108 (90 Base) MCG/ACT inhaler, Inhale 2 puffs into the lungs every 6 (six) hours as needed for wheezing or shortness of breath., Disp: 8 g, Rfl: 0   ALPRAZolam (XANAX) 0.25 MG tablet, TAKE 1 TABLET BY MOUTH TWICE A DAY AS NEEDED FOR ANXIETY *MUST LAST 30 DAYS, Disp: , Rfl:    benzonatate  (TESSALON ) 100 MG capsule, Take 1 capsule (100 mg total) by mouth 3 (three) times daily as needed for  cough., Disp: 30 capsule, Rfl: 0   doxycycline  (VIBRA -TABS) 100 MG tablet, Take 1 tablet (100 mg total) by mouth 2 (two) times daily for 7 days., Disp: 14 tablet, Rfl: 0   lisinopril-hydrochlorothiazide (ZESTORETIC) 20-12.5 MG tablet, 1 tablet, Disp: , Rfl:    pantoprazole (PROTONIX) 40 MG tablet, , Disp: , Rfl:    promethazine  (PHENERGAN ) 12.5 MG tablet, Take 1 tablet (12.5 mg total) by mouth every 8 (eight) hours as needed (severe cough)., Disp: 20 tablet, Rfl: 0  "

## 2024-09-17 ENCOUNTER — Ambulatory Visit
